# Patient Record
Sex: Female | Born: 1935 | Race: White | Hispanic: No | Smoking: Current every day smoker
Health system: Southern US, Community
[De-identification: ages and names within clinical notes are randomized; demographics above are authoritative.]

## PROBLEM LIST (undated history)

## (undated) DIAGNOSIS — I10 Essential (primary) hypertension: Secondary | ICD-10-CM

## (undated) DIAGNOSIS — J439 Emphysema, unspecified: Secondary | ICD-10-CM

## (undated) DIAGNOSIS — M1990 Unspecified osteoarthritis, unspecified site: Secondary | ICD-10-CM

## (undated) DIAGNOSIS — I472 Ventricular tachycardia: Secondary | ICD-10-CM

## (undated) DIAGNOSIS — I639 Cerebral infarction, unspecified: Secondary | ICD-10-CM

## (undated) DIAGNOSIS — N189 Chronic kidney disease, unspecified: Secondary | ICD-10-CM

## (undated) DIAGNOSIS — I671 Cerebral aneurysm, nonruptured: Secondary | ICD-10-CM

## (undated) DIAGNOSIS — I8390 Asymptomatic varicose veins of unspecified lower extremity: Secondary | ICD-10-CM

## (undated) DIAGNOSIS — E785 Hyperlipidemia, unspecified: Secondary | ICD-10-CM

## (undated) DIAGNOSIS — E041 Nontoxic single thyroid nodule: Secondary | ICD-10-CM

## (undated) DIAGNOSIS — I480 Paroxysmal atrial fibrillation: Secondary | ICD-10-CM

## (undated) DIAGNOSIS — I2510 Atherosclerotic heart disease of native coronary artery without angina pectoris: Secondary | ICD-10-CM

## (undated) DIAGNOSIS — D649 Anemia, unspecified: Secondary | ICD-10-CM

## (undated) DIAGNOSIS — J449 Chronic obstructive pulmonary disease, unspecified: Secondary | ICD-10-CM

## (undated) DIAGNOSIS — H269 Unspecified cataract: Secondary | ICD-10-CM

## (undated) DIAGNOSIS — K862 Cyst of pancreas: Secondary | ICD-10-CM

## (undated) DIAGNOSIS — I739 Peripheral vascular disease, unspecified: Secondary | ICD-10-CM

## (undated) DIAGNOSIS — E119 Type 2 diabetes mellitus without complications: Secondary | ICD-10-CM

## (undated) DIAGNOSIS — I4729 Other ventricular tachycardia: Secondary | ICD-10-CM

## (undated) HISTORY — DX: Chronic kidney disease, unspecified: N18.9

## (undated) HISTORY — DX: Anemia, unspecified: D64.9

## (undated) HISTORY — DX: Unspecified osteoarthritis, unspecified site: M19.90

## (undated) HISTORY — DX: Unspecified cataract: H26.9

## (undated) HISTORY — DX: Hyperlipidemia, unspecified: E78.5

## (undated) HISTORY — DX: Asymptomatic varicose veins of unspecified lower extremity: I83.90

## (undated) HISTORY — DX: Type 2 diabetes mellitus without complications: E11.9

## (undated) HISTORY — DX: Cerebral infarction, unspecified: I63.9

## (undated) HISTORY — DX: Peripheral vascular disease, unspecified: I73.9

## (undated) HISTORY — DX: Emphysema, unspecified: J43.9

## (undated) HISTORY — DX: Essential (primary) hypertension: I10

## (undated) HISTORY — DX: Cyst of pancreas: K86.2

## (undated) HISTORY — DX: Chronic obstructive pulmonary disease, unspecified: J44.9

## (undated) HISTORY — DX: Nontoxic single thyroid nodule: E04.1

## (undated) HISTORY — DX: Cerebral aneurysm, nonruptured: I67.1

---

## 1991-02-21 DIAGNOSIS — I671 Cerebral aneurysm, nonruptured: Secondary | ICD-10-CM

## 1991-02-21 HISTORY — DX: Cerebral aneurysm, nonruptured: I67.1

## 1997-02-20 HISTORY — PX: PR VEIN BYPASS GRAFT,AORTO-FEM-POP: 35551

## 1997-05-22 ENCOUNTER — Emergency Department (HOSPITAL_COMMUNITY): Admission: EM | Admit: 1997-05-22 | Discharge: 1997-05-22 | Payer: Self-pay | Admitting: Emergency Medicine

## 1998-01-09 ENCOUNTER — Emergency Department (HOSPITAL_COMMUNITY): Admission: EM | Admit: 1998-01-09 | Discharge: 1998-01-09 | Payer: Self-pay | Admitting: Emergency Medicine

## 2000-03-20 ENCOUNTER — Other Ambulatory Visit: Admission: RE | Admit: 2000-03-20 | Discharge: 2000-03-20 | Payer: Self-pay | Admitting: Internal Medicine

## 2000-10-24 ENCOUNTER — Encounter: Payer: Self-pay | Admitting: Internal Medicine

## 2000-10-24 ENCOUNTER — Encounter: Admission: RE | Admit: 2000-10-24 | Discharge: 2000-10-24 | Payer: Self-pay | Admitting: Internal Medicine

## 2001-03-27 ENCOUNTER — Encounter: Admission: RE | Admit: 2001-03-27 | Discharge: 2001-03-27 | Payer: Self-pay | Admitting: Internal Medicine

## 2001-03-27 ENCOUNTER — Encounter: Payer: Self-pay | Admitting: Internal Medicine

## 2001-10-28 ENCOUNTER — Encounter: Payer: Self-pay | Admitting: Internal Medicine

## 2001-10-28 ENCOUNTER — Encounter: Admission: RE | Admit: 2001-10-28 | Discharge: 2001-10-28 | Payer: Self-pay | Admitting: Internal Medicine

## 2002-10-23 ENCOUNTER — Emergency Department (HOSPITAL_COMMUNITY): Admission: EM | Admit: 2002-10-23 | Discharge: 2002-10-23 | Payer: Self-pay | Admitting: Emergency Medicine

## 2002-10-23 ENCOUNTER — Encounter: Payer: Self-pay | Admitting: Emergency Medicine

## 2003-06-03 ENCOUNTER — Emergency Department (HOSPITAL_COMMUNITY): Admission: EM | Admit: 2003-06-03 | Discharge: 2003-06-03 | Payer: Self-pay | Admitting: Emergency Medicine

## 2003-06-23 ENCOUNTER — Emergency Department (HOSPITAL_COMMUNITY): Admission: EM | Admit: 2003-06-23 | Discharge: 2003-06-24 | Payer: Self-pay | Admitting: Emergency Medicine

## 2007-03-14 ENCOUNTER — Encounter: Admission: RE | Admit: 2007-03-14 | Discharge: 2007-03-14 | Payer: Self-pay | Admitting: Internal Medicine

## 2007-03-18 ENCOUNTER — Encounter: Admission: RE | Admit: 2007-03-18 | Discharge: 2007-03-18 | Payer: Self-pay | Admitting: Internal Medicine

## 2007-03-22 ENCOUNTER — Ambulatory Visit: Payer: Self-pay | Admitting: Vascular Surgery

## 2007-06-25 ENCOUNTER — Encounter: Admission: RE | Admit: 2007-06-25 | Discharge: 2007-06-25 | Payer: Self-pay | Admitting: Internal Medicine

## 2007-07-02 ENCOUNTER — Ambulatory Visit (HOSPITAL_COMMUNITY): Admission: RE | Admit: 2007-07-02 | Discharge: 2007-07-02 | Payer: Self-pay | Admitting: Internal Medicine

## 2007-07-16 ENCOUNTER — Telehealth: Payer: Self-pay | Admitting: Gastroenterology

## 2007-07-24 ENCOUNTER — Ambulatory Visit: Payer: Self-pay | Admitting: Gastroenterology

## 2007-07-24 DIAGNOSIS — R933 Abnormal findings on diagnostic imaging of other parts of digestive tract: Secondary | ICD-10-CM

## 2007-07-24 LAB — CONVERTED CEMR LAB
Alkaline Phosphatase: 62 units/L (ref 39–117)
Amylase: 63 units/L (ref 27–131)
Basophils Absolute: 0 10*3/uL (ref 0.0–0.1)
Basophils Relative: 0.5 % (ref 0.0–1.0)
CO2: 28 meq/L (ref 19–32)
Creatinine, Ser: 0.6 mg/dL (ref 0.4–1.2)
GFR calc Af Amer: 125 mL/min
GFR calc non Af Amer: 104 mL/min
Glucose, Bld: 319 mg/dL — ABNORMAL HIGH (ref 70–99)
HCT: 33.4 % — ABNORMAL LOW (ref 36.0–46.0)
Hemoglobin: 11.5 g/dL — ABNORMAL LOW (ref 12.0–15.0)
Lipase: 22 units/L (ref 11.0–59.0)
MCHC: 34.3 g/dL (ref 30.0–36.0)
Monocytes Absolute: 0.6 10*3/uL (ref 0.1–1.0)
Monocytes Relative: 7.5 % (ref 3.0–12.0)
Neutro Abs: 4.3 10*3/uL (ref 1.4–7.7)
RBC: 3.65 M/uL — ABNORMAL LOW (ref 3.87–5.11)
RDW: 12.9 % (ref 11.5–14.6)
Sodium: 141 meq/L (ref 135–145)
Total Bilirubin: 0.6 mg/dL (ref 0.3–1.2)
Total Protein: 7.6 g/dL (ref 6.0–8.3)
aPTT: 28 s (ref 21.7–29.8)

## 2007-07-25 ENCOUNTER — Telehealth: Payer: Self-pay | Admitting: Gastroenterology

## 2007-08-01 ENCOUNTER — Ambulatory Visit (HOSPITAL_COMMUNITY): Admission: RE | Admit: 2007-08-01 | Discharge: 2007-08-01 | Payer: Self-pay | Admitting: Gastroenterology

## 2007-08-01 ENCOUNTER — Encounter: Payer: Self-pay | Admitting: Gastroenterology

## 2007-08-05 ENCOUNTER — Encounter: Payer: Self-pay | Admitting: Gastroenterology

## 2007-08-06 ENCOUNTER — Telehealth: Payer: Self-pay | Admitting: Gastroenterology

## 2007-08-07 ENCOUNTER — Telehealth: Payer: Self-pay | Admitting: Gastroenterology

## 2007-08-08 ENCOUNTER — Ambulatory Visit: Payer: Self-pay | Admitting: Cardiology

## 2007-08-08 ENCOUNTER — Encounter (INDEPENDENT_AMBULATORY_CARE_PROVIDER_SITE_OTHER): Payer: Self-pay | Admitting: Internal Medicine

## 2007-08-08 ENCOUNTER — Inpatient Hospital Stay (HOSPITAL_COMMUNITY): Admission: AD | Admit: 2007-08-08 | Discharge: 2007-08-26 | Payer: Self-pay | Admitting: Pulmonary Disease

## 2007-08-08 ENCOUNTER — Ambulatory Visit: Payer: Self-pay | Admitting: Internal Medicine

## 2007-08-08 ENCOUNTER — Ambulatory Visit: Payer: Self-pay | Admitting: Gastroenterology

## 2007-08-09 ENCOUNTER — Encounter (INDEPENDENT_AMBULATORY_CARE_PROVIDER_SITE_OTHER): Payer: Self-pay | Admitting: Internal Medicine

## 2007-11-05 ENCOUNTER — Ambulatory Visit: Payer: Self-pay | Admitting: Gastroenterology

## 2007-11-22 ENCOUNTER — Telehealth: Payer: Self-pay | Admitting: Gastroenterology

## 2007-11-26 ENCOUNTER — Telehealth: Payer: Self-pay | Admitting: Gastroenterology

## 2007-11-26 ENCOUNTER — Encounter: Admission: RE | Admit: 2007-11-26 | Discharge: 2007-11-26 | Payer: Self-pay | Admitting: Internal Medicine

## 2007-12-12 ENCOUNTER — Telehealth: Payer: Self-pay | Admitting: Gastroenterology

## 2007-12-12 ENCOUNTER — Encounter: Payer: Self-pay | Admitting: Gastroenterology

## 2007-12-16 ENCOUNTER — Encounter: Payer: Self-pay | Admitting: Gastroenterology

## 2008-03-05 ENCOUNTER — Encounter: Admission: RE | Admit: 2008-03-05 | Discharge: 2008-03-05 | Payer: Self-pay | Admitting: Internal Medicine

## 2008-04-30 ENCOUNTER — Ambulatory Visit: Payer: Self-pay | Admitting: Vascular Surgery

## 2009-02-05 ENCOUNTER — Encounter: Admission: RE | Admit: 2009-02-05 | Discharge: 2009-02-05 | Payer: Self-pay | Admitting: Internal Medicine

## 2009-04-28 IMAGING — RF DG ABDOMEN 1V
3 series · 3 of 3 positions shown · non-contrast
Comparison: 08/11/2007

CLINICAL DATA: Evaluate panda tube placement.

ABDOMEN - 1 VIEW

[Series 1: run · 1 of 1 slices shown (1 of 3)]
[im 1/1]
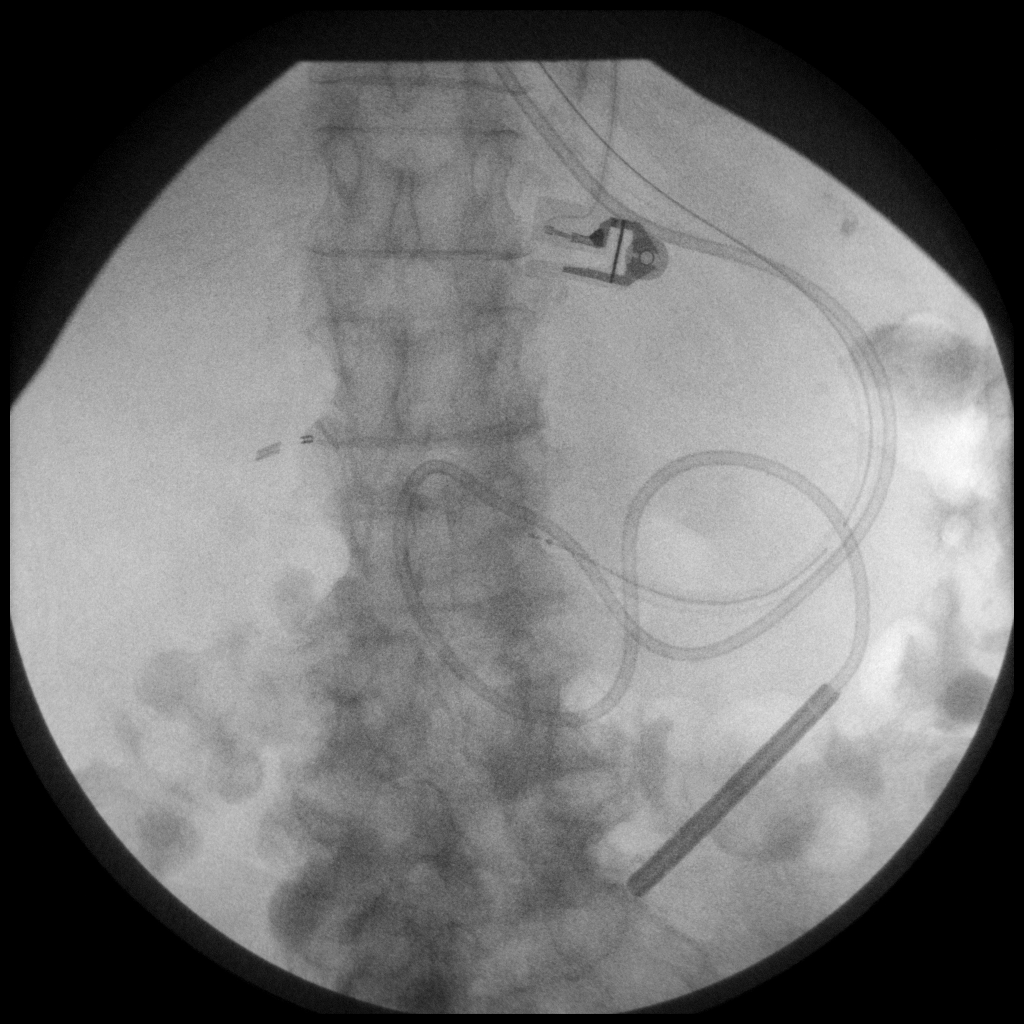

[Series 2: run · 1 of 1 slices shown (2 of 3)]
[im 1/1]
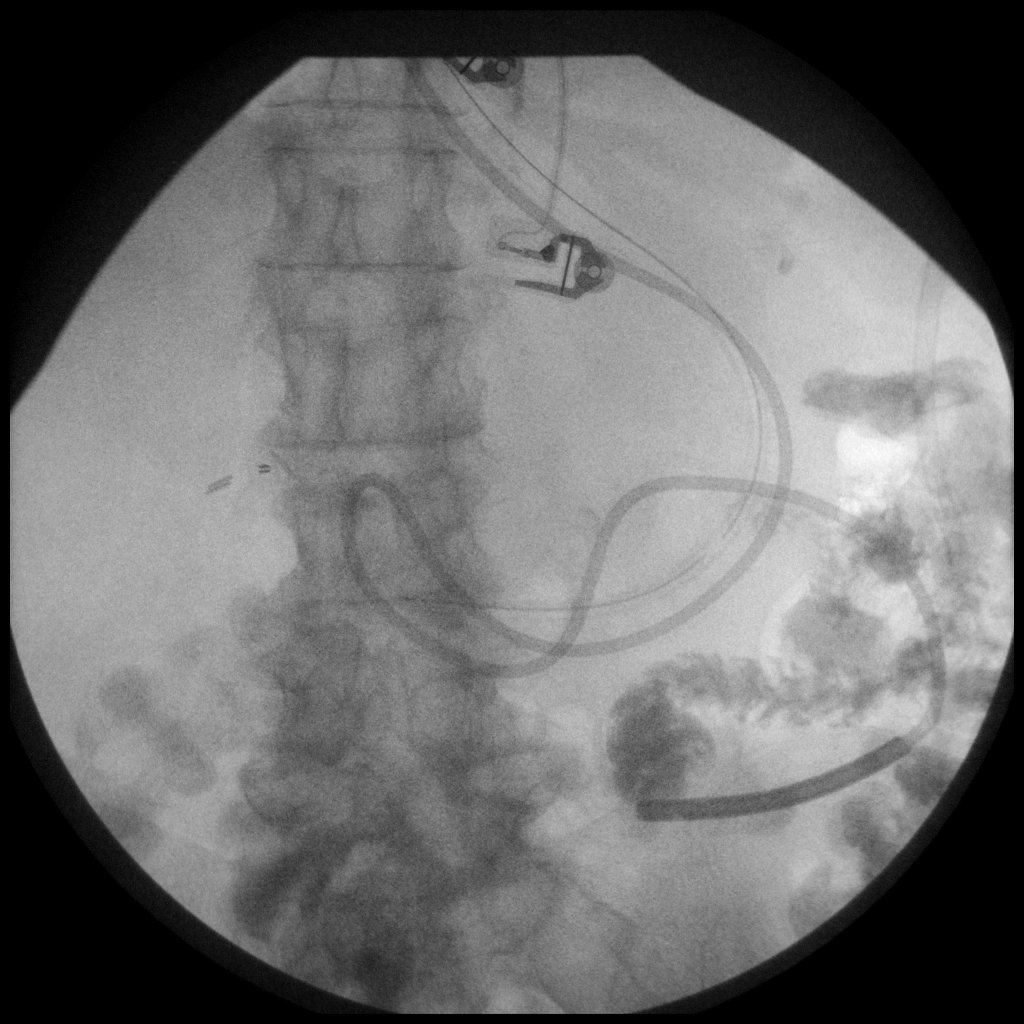

[Series 3: run · 1 of 1 slices shown (3 of 3)]
[im 1/1]
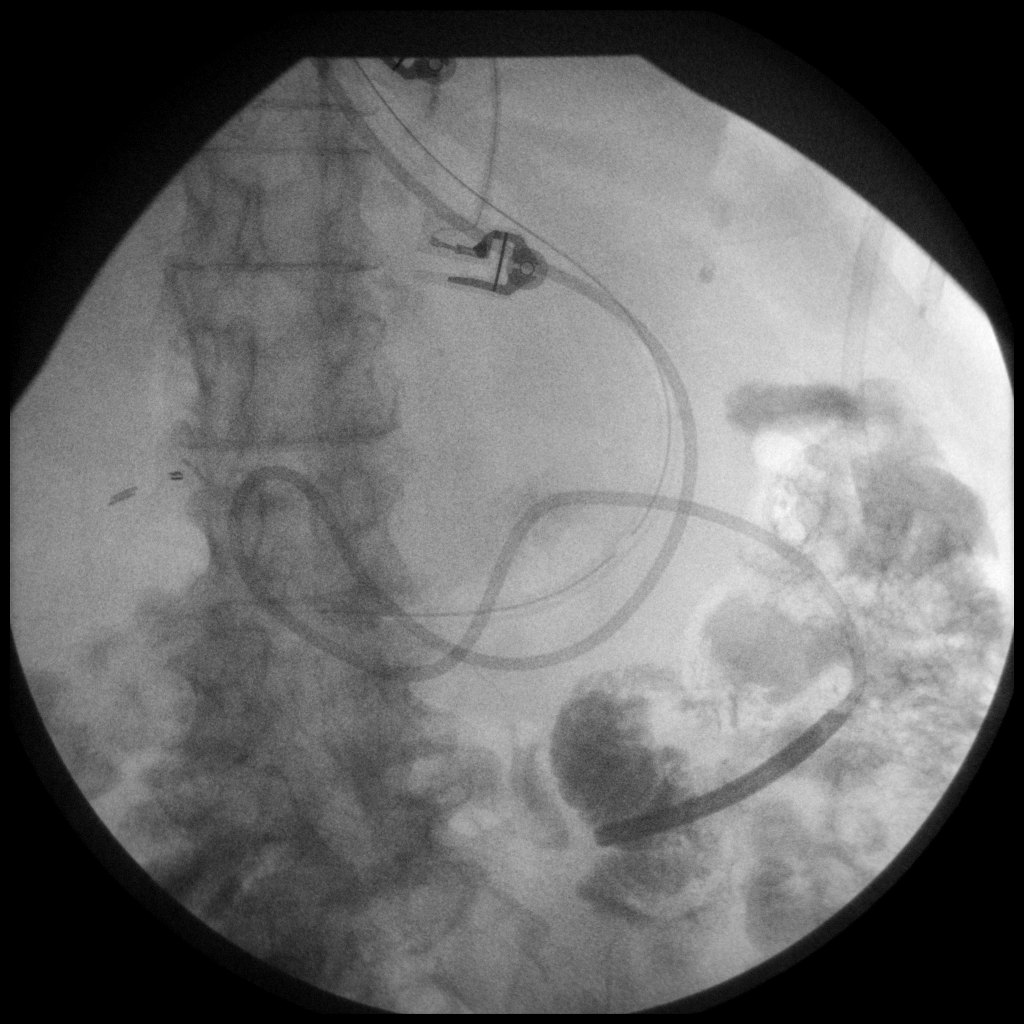

[3 of 3 positions shown; findings below may reference images not displayed]

FINDINGS: Feeding tube has been placed.  The feeding tube tip is
beyond the level of the ligament of Treitz.  Injection of water-
soluble contrast material confirms jejunal placement of the feeding
tube.
IMPRESSION: 1.  Tip of feeding tube beyond the level of the ligament of Treitz
within the proximal jejunum.

## 2009-04-28 IMAGING — CR DG CHEST 1V PORT
1 series · 1 of 1 positions shown · non-contrast
Comparison: 08/11/2007

CLINICAL DATA: Metabolic acidosis and renal failure

PORTABLE CHEST - 1 VIEW

[AP]
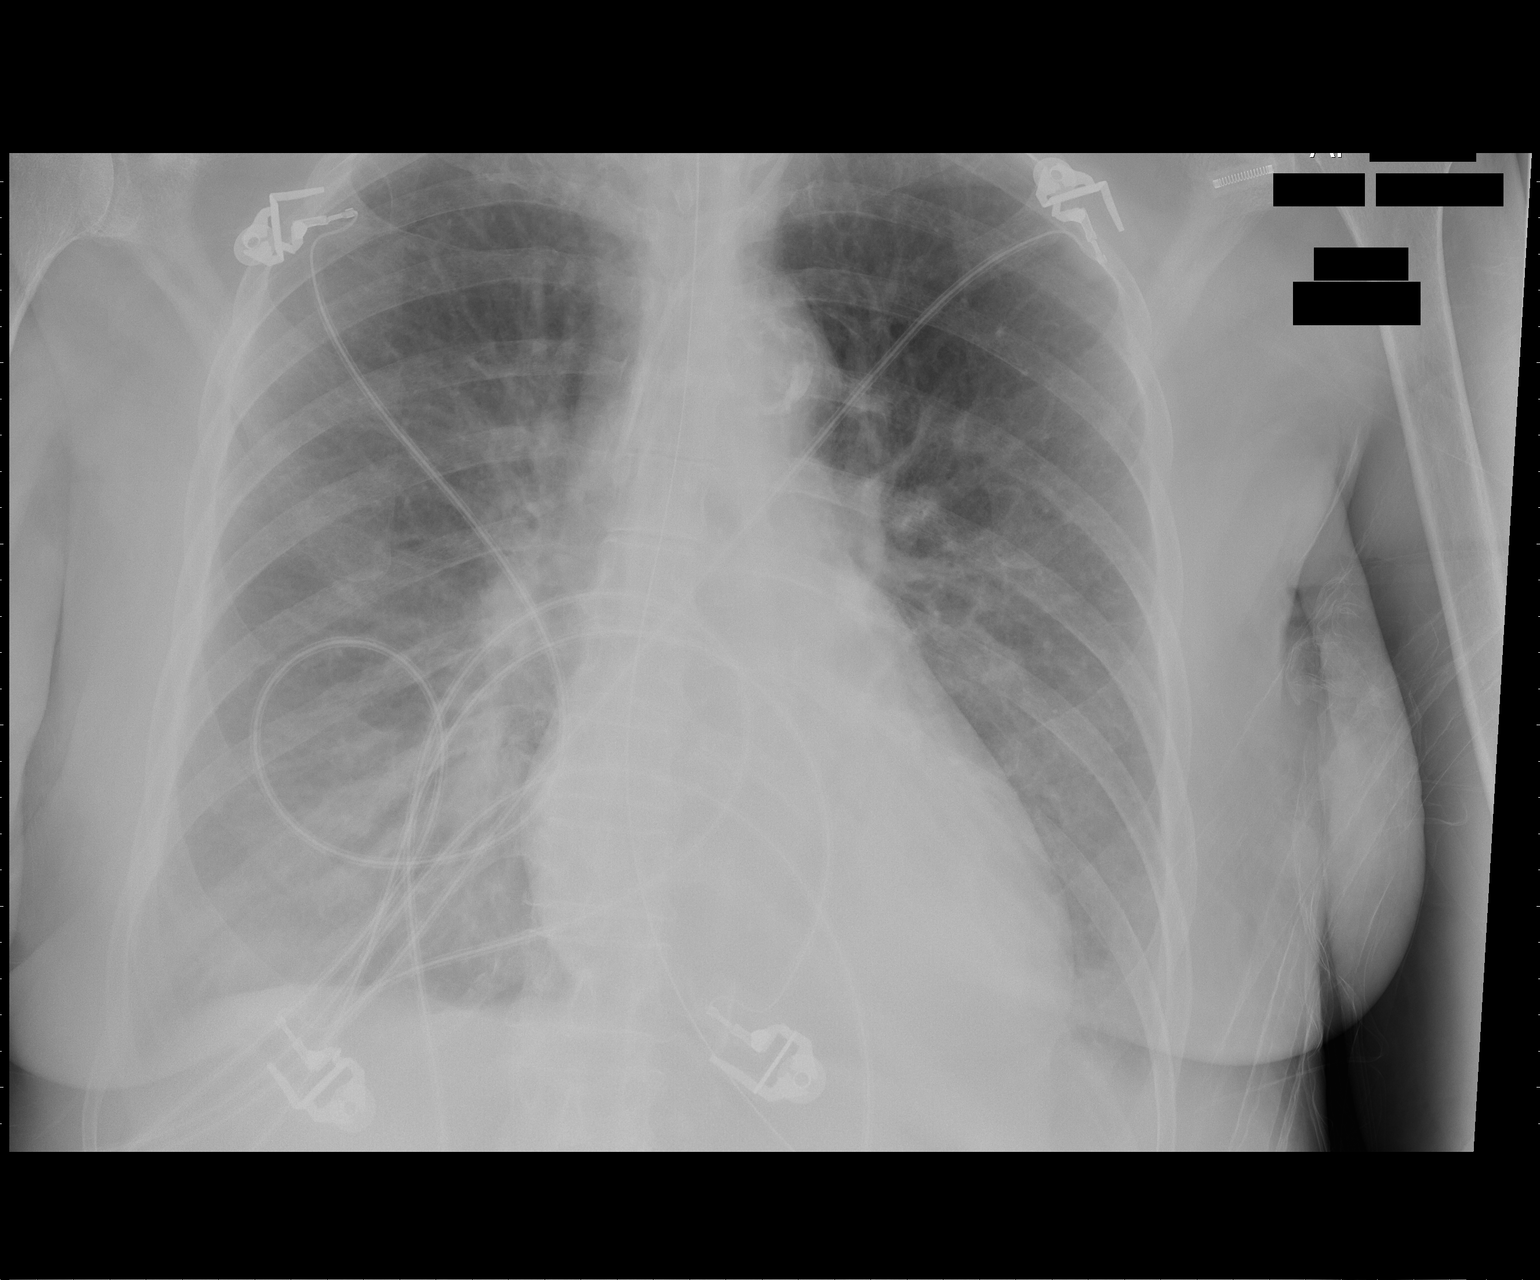

[1 of 1 positions shown; findings below may reference images not displayed]

FINDINGS: There is a left IJ catheter with tip in the SVC.

There is a nasogastric tube with tip in the stomach.

Heart size is normal.

The small effusions and interstitial edema consistent with mild
CHF.
IMPRESSION: 1.  Mild congestive heart failure.

## 2009-04-29 IMAGING — CR DG CHEST 1V PORT
1 series · 1 of 1 positions shown · non-contrast
Comparison: 1 day prior

CLINICAL DATA: Metabolic acidosis.  Renal failure.

PORTABLE CHEST - 1 VIEW

[AP]
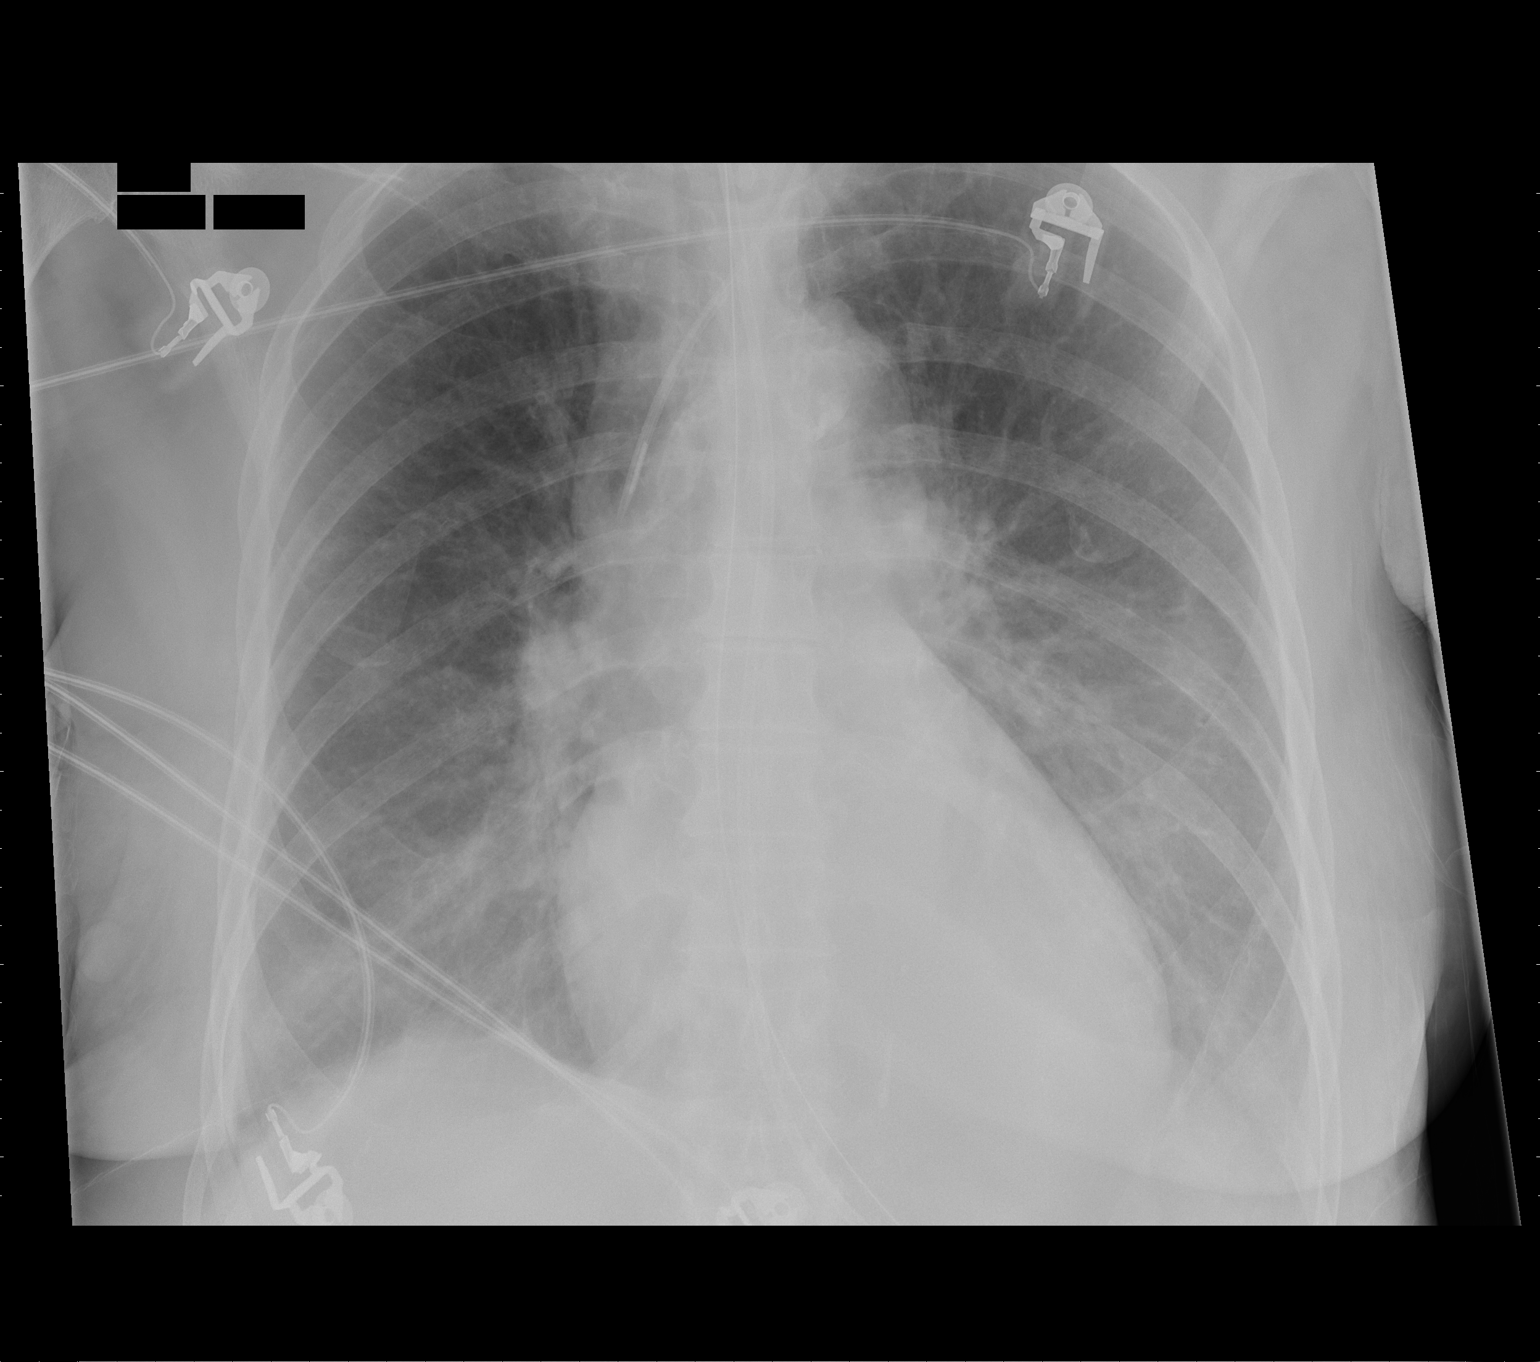

[1 of 1 positions shown; findings below may reference images not displayed]

FINDINGS: Nasogastric and feeding tubes extend beyond the  inferior
aspect of the film.    Left IJ line is unchanged. Normal heart size
and mediastinal contours. Small layering bilateral pleural
effusions are similar. No pneumothorax.  Minimal improvement in
interstitial edema.  Left lower lobe airspace disease is unchanged.
IMPRESSION: 1.  Slight improvement in congestive failure with persistent
bilateral pleural effusions.
2.  Left lower lobe atelectasis versus less likely infection.
3.  Interval placement of a feeding tube, extending beyond inferior
aspect the film.

## 2009-04-30 IMAGING — CR DG CHEST 1V PORT
1 series · 1 of 1 positions shown · non-contrast
Comparison: 08/13/2007.

CLINICAL DATA: Respiratory distress.

PORTABLE CHEST - 1 VIEW

[AP]
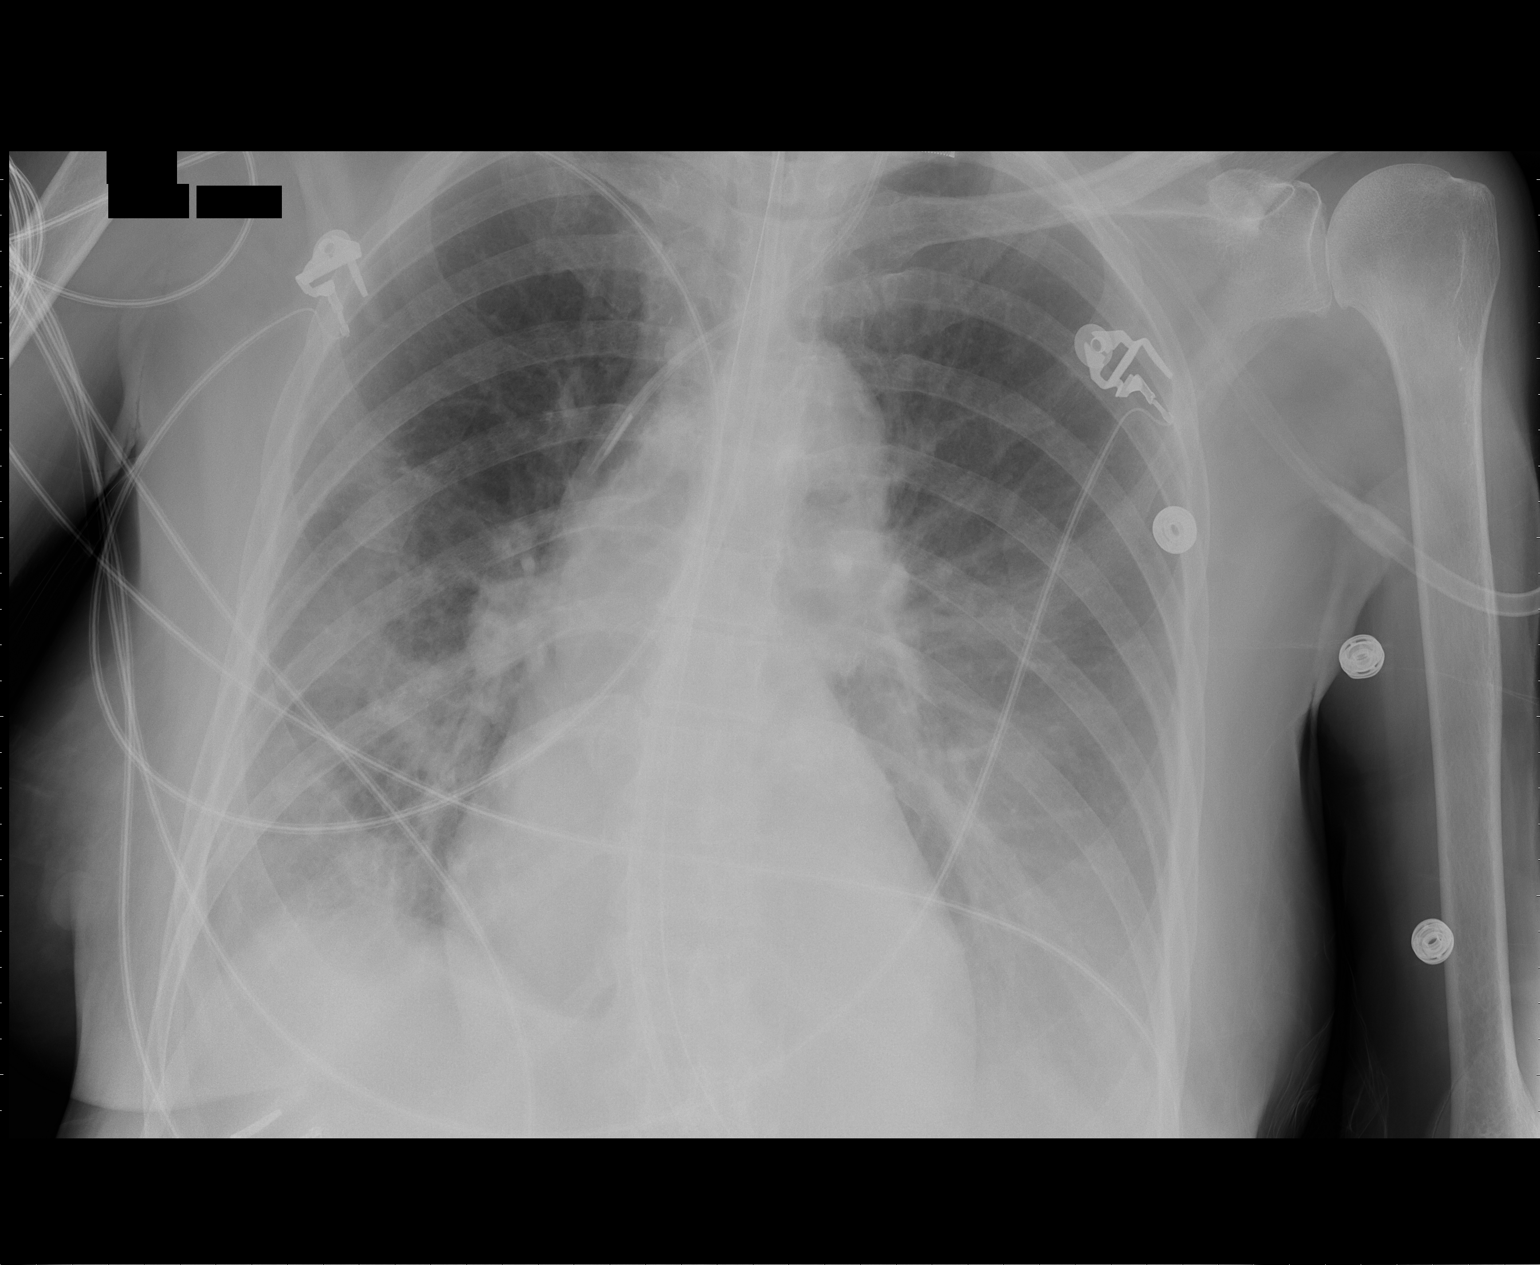

[1 of 1 positions shown; findings below may reference images not displayed]

FINDINGS: Nasogastric tube and feeding tube are followed into the
stomach with the tips projecting beyond the inferior boundary the
film.  Left IJ central line terminates in the SVC.  Pulmonary
edema, bilateral pleural effusions and bibasilar atelectasis
persist.
IMPRESSION: Pulmonary edema, bilateral pleural effusions and bibasilar
atelectasis.

## 2009-05-03 IMAGING — CR DG CHEST 1V PORT
1 series · 1 of 1 positions shown · non-contrast
Comparison: Portable exam 6534 hours compared to 08/15/2007

CLINICAL DATA: Respiratory distress, weakness, renal failure,
metabolic acidosis

PORTABLE CHEST - 1 VIEW

[view not recorded]
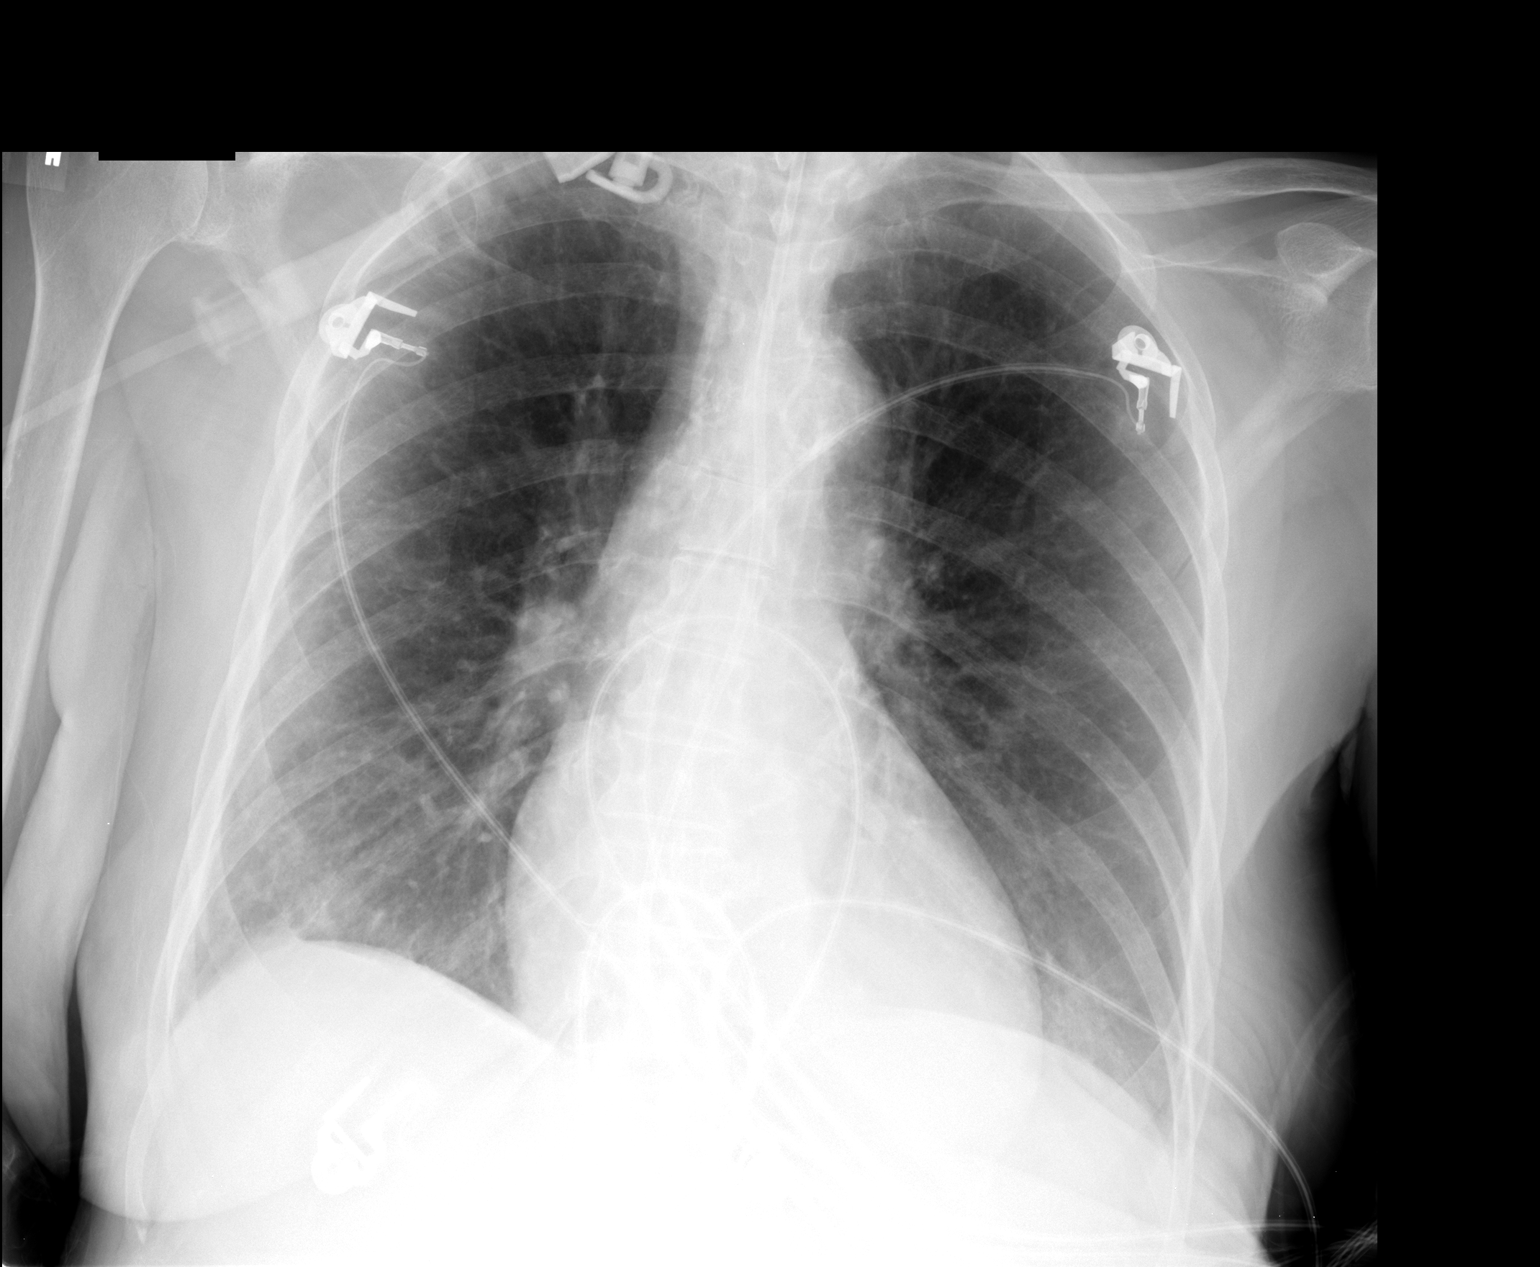

[1 of 1 positions shown; findings below may reference images not displayed]

FINDINGS: Normal heart size and pulmonary vascularity for technique.
Calcified tortuous aorta.
Feeding tube extends into stomach.
Cardiac monitoring lines project over chest.
Improving pulmonary infiltrates and basilar atelectasis.
Remaining lungs clear.
IMPRESSION: Improving pulmonary aeration since previous exam.

## 2010-02-18 ENCOUNTER — Encounter
Admission: RE | Admit: 2010-02-18 | Discharge: 2010-02-18 | Payer: Self-pay | Source: Home / Self Care | Attending: Internal Medicine | Admitting: Internal Medicine

## 2010-03-13 ENCOUNTER — Encounter: Payer: Self-pay | Admitting: Internal Medicine

## 2010-07-05 NOTE — Discharge Summary (Signed)
Chelsey Bishop, Chelsey Bishop                 ACCOUNT NO.:  [id number]   MEDICAL RECORD NO.:  [id number]          PATIENT TYPE:  INP   LOCATION:  5523                         FACILITY:  [facility]   PHYSICIAN:  Chelsey Scott, MD     DATE OF BIRTH:  April 29, 1929   DATE OF ADMISSION:  08/08/2007  DATE OF DISCHARGE:  08/26/2007                               DISCHARGE SUMMARY   PRIMARY MEDICAL DOCTOR:  Dr. Nila Bishop.   GASTROENTEROLOGIST:  Dr. Melvia Bishop of [facility].   DISCHARGE DIAGNOSES:  1. Uncontrolled type 2 diabetes on insulin.  2. Uncontrolled hypertension.  3. Acute renal failure - resolved.  4. Clostridium difficile colitis.  5. Ventricular tachycardia.  Normal Myoview.  6. Resolved septic shock, MODS, ventilatory-dependent respiratory      failure.  7. Sepsis ? secondary to pancreatitis post endoscopic ultrasound and      FNA of pancreatic pseudocyst.  8. Hypokalemia.  9. Hypomagnesemia.  10.Anemia.  11.? Pancreatitis, post endoscopic ultrasound and FNA of pancreatic      pseudocyst..  12.Dementia.  13.Elevated troponins.  14.Old left frontal lobe infarct on CT  15.Lingular nodule on CT chest - outpatient followup & evaluation as      deemed necessary.   DISCHARGE MEDICATIONS:  1. Flagyl 250 mg p.o. q.i.d. through 8th of July 2009, then      discontinue.  2. Protonix 40 mg p.o. daily.  3. Metoprolol 50 mg p.o. b.i.d.  4. Enteric-coated aspirin 81 mg p.o. daily.  5. Zocor 40 mg p.o. every night.  6. Prednisone taper per instructions.  7. Lisinopril 40 mg p.o. daily.  8. NovoLog with FlexPen 4 units at each meal.  9. Lantus 16 units in the a.m. daily.  10.Vitamin B12 1000 mcg p.o daily  11.Thiamine 100 mg p.o daily   DISCONTINUED MEDICATIONS:  Procardia, Januvia, metformin, Amaryl.   PERTINENT PROCEDURES:  1. Myoview.  Impression:  Normal myocardial perfusion study.  2. Multiple chest x-rays since the patient was in ventilator-dependent      respiratory failure.  Most  recent one on August 17, 2007 -      impression:  Improving pulmonary aeration since previous exam.  3. Abdominal x-ray on June 22:  Tip of feeding tube beyond the level      of ligamentous of Treitz within the proximal jejunum.  4. Multiple x-rays of the abdomen.  5. CT of the head without contrast on June 19 - impression:      a.     No acute intracranial abnormality.      b.     Severe cerebral atrophy and small-vessel disease.      c.     Remote left frontal lobe infarct.      d.     Motion-degraded exam.  6. CT of the chest without contrast - impression:      a.     No evidence of pneumonia.      b.     Lingular nodule is stable and has been evaluated on recent       studies.  c.     Small bilateral pleural effusions.      d.     Dilated contrast from the esophagus suggests dysmotility.       The nasogastric tube terminates in the distal esophagus and should       be advanced.  7. CT of the abdomen without contrast - impression:      a.     Limited exam due to lack of IV contrast and mild motion       artifact.      b.     Descending colitis.  Likely infectious.      c.     Limited evaluation of the pancreas with peripancreatic       fluid, edema suspicious for pancreatitis.  Likely secondary       gastritis and enteritis as described.      d.     New small amount of ascites.  8. CT of the pelvis without contrast - impression:      a.     Small ascites.      b.     Otherwise no acute pelvic process.   PERTINENT LABORATORY DATA:  BMET on July 4 unremarkable with BUN 25,  creatinine 1.13.  Most recent CBC with hemoglobin 9.3, hematocrit 27.8,  white blood cell 9.4, platelets 234.  Vitamin B1, 9.  Clostridium  difficile toxin on June 24 positive.  RBC folate 456, vitamin B12, 203.  Hepatitis B surface antigen negative.   CONSULTATIONS:  1. Renal, Dr. Hyman Bishop.  2. Pulmonary critical care, Dr.  Tyson Bishop.  3. Cardiology.  4. Endocrinology, Dr. Lucianne Bishop.  5. Neurology, Dr. Lesia Bishop.   HOSPITAL COURSE AND PATIENT DISPOSITION:  This patient was initially  admitted by the pulmonary critical care team, and her care was  transferred to the [facility] hospitalist service on August 19, 2007.  In  summary, Ms. Bishop is a pleasant 77 year old female patient with  history of diabetes type 2, hypertension, recent episode of blindness  being followed by neurology, COPD who had endoscopic ultrasound  aspiration of pancreatic cyst on August 02, 2007 which was consistent with  pseudocyst.  She was subsequently admitted to [facility] on June  17 after history of indigestion, bloating, heartburn, crampy abdominal  pain, nausea and vomiting.  She rapidly worsened and went into  multisystem organ failure and developed blindness.  She was thereby  transferred to [facility] for further evaluation and  management.  She also was in shock, acute renal failure, hyperkalemia.  She was admitted to the ICU and was intubated.  Septic shock was treated  appropriately and resolved.  The patient received hemodialysis under  renal team supervision.  Neurology followed the patient for her new  onset of blindness, and pulmonary critical care managed her vent issues.  Her renal failure was thought to be secondary to acute tubular necrosis  and multiorgan failure.  She was on pressors and steroids when she was  in septic shock and empiric antibiotics of IV imipenem and vancomycin.  Workup for blindness was done.  CT of the head was negative.  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  The patient also had elevated troponins.  Cardiology was consulted.  She  had an episode of ventricular tachycardia.  Cardiology did extensive  evaluation.  Myoview was negative.  They have advised continuing her  medication and titrating lisinopril to adequate blood pressure control.  Her blindness subsequently  resolved.  She was transferred to the medical floor,  and her care was handed over  to the [facility] hospitalist team.  Since she had been on our service,  the predominant issue has been electrolyte abnormalities of hypokalemia  and hypomagnesemia and diabetic control.  This diabetic control is  fluctuating following which endocrinology consult was obtained.  Her  steroids were contributing to this uncontrolled diabetes.  The steroids  are being rapidly tapered, and her insulin was adjusted with fair  control.  The patient at this time is asymptomatic with stable exams.  Her blood pressure is still not optimal, ranging in the 170s to 190s  systolic over 70s to 80s diastolic.  Will increase lisinopril today.  The patient is advised to follow-up with her primary medical doctor in a  week's time regarding adjusting her diabetic and blood pressure  medications.  She is also going to get advanced home care with necessary  equipment at home.      Chelsey Scott, MD  Electronically Signed     AH/MEDQ  D:  08/26/2007  T:  08/26/2007  Job:  [id number]   cc:   Erskine Speed, M.D.  Barbette Hair. Arlyce Dice, MD,FACG  Garnetta Buddy, M.D.  Nelda Bucks, MD  Reather Littler, M.D.  Marlan Palau, M.D.

## 2010-07-05 NOTE — Procedures (Signed)
BYPASS GRAFT EVALUATION   INDICATION:  Followup right leg bypass graft.   HISTORY:  Diabetes:  Yes.  Cardiac:  No.  Hypertension:  Yes.  Smoking:  No, quit in October of 2008.  Previous Surgery:  Right femoral to below knee popliteal artery bypass  graft with translocated nonreverse saphenous vein on 06/24/1993 by Dr.  Arbie Cookey.   SINGLE LEVEL ARTERIAL EXAM                               RIGHT              LEFT  Brachial:                    170                164  Anterior tibial:             78                 100  Posterior tibial:            Inaudible          88  Peroneal:                    74  Ankle/brachial index:        0.46               0.59   PREVIOUS ABI:  Date: 12/03/2001  RIGHT:  1.0  LEFT:  0.69   LOWER EXTREMITY BYPASS GRAFT DUPLEX EXAM:   DUPLEX:  Doppler arterial waveforms are biphasic proximal to, throughout  and distal to the graft.   IMPRESSION:  1. Patent right femoral to popliteal artery bypass graft.  2. ABIs are decreased bilaterally right greater than left.  3. The patient is complaining of some right leg discomfort, however,      she does have some problems with discomfort in her right leg in the      past.  4. A preliminary copy was sent with the patient to Dr. Thomasene Lot office.   ___________________________________________  Larina Earthly, M.D.   DP/MEDQ  D:  03/22/2007  T:  03/22/2007  Job:  [id number]   cc:   Erskine Speed, M.D.

## 2010-07-05 NOTE — Consult Note (Signed)
Chelsey Bishop, Chelsey Bishop                 ACCOUNT NO.:  [id number]   MEDICAL RECORD NO.:  [id number]          PATIENT TYPE:  INP   LOCATION:  2103                         FACILITY:  [facility]   PHYSICIAN:  Madolyn Frieze. Jens Som, MD, FACCDATE OF BIRTH:  1930-01-27   DATE OF CONSULTATION:  08/15/2007  DATE OF DISCHARGE:                                 CONSULTATION   Chelsey Bishop is a 77 year old female with multiple medical problems who I  am asked to evaluate for tachycardia.  The patient's history dates back  to an episode of severe pancreatitis in 2008.  She underwent recent  EGD/ultrasound-guided aspiration of a pancreatic cyst on August 01, 2007.  She was admitted to [facility] on August 01, 2007, with shock,  acute renal failure, and multisystem organ dysfunction.  She was  transferred to [facility] on August 08, 2007.  Note, on  admission, she was septic, on multiple pressors.  She also had acute  renal failure with BUN and creatinine of 59 and 7.69.  She has been  treated with antibiotics and slowly improved and has been off pressors  for approximately 4-5 days.  She was also dialyzed transiently, but is  now making more urine and the dialysis catheter has been removed.  Also  note, on admission that her CK-MB and troponin I were elevated.  This  was at 135 and 15.1 and the troponin I was 0.38.  There were no followup  enzymes drawn.  During the admission, she has been noted to be  intermittently tachycardic and we were asked to further evaluate.  Note,  the patient is confused and apparently has baseline dementia with  superimposed delirium, which has improved as well.  However, she is  unaware of where she is this morning and thinks her age is 35.  However,  she denies any chest pain, dyspnea, palpitations, or presyncope.   CURRENT MEDICATIONS:  Vancomycin, Solu-Cortef, subcutaneous heparin,  Protonix 40 mg daily, prednisone 20 mg p.o. daily, Norvasc 10 mg p.o.  daily, insulin,  vitamin B12, and Primaxin.   SOCIAL HISTORY:  Obtained from the chart.  She apparently lives in  [place] and has 2 children.  One son was killed and she has a surviving  daughter.  She does have a history of tobacco abuse, but apparently does  not smoke at present.  She also occasionally consumes alcohol.   FAMILY HISTORY:  Significant for her mother who had a stroke.  Her  father had a myocardial infarction.  One sibling had a myocardial  function as well per the chart.   PAST MEDICAL HISTORY:  Significant for diabetes mellitus as well as  hypertension.  She apparently had a cerebral aneurysm resection in 1993  as well as left cataract surgery.  She has a recent episode of blindness  that Neurology is seeing her for.  She has acute renal failure.  She  also has a history of partial hysterectomy as well as cholecystectomy.  She has a history of COPD.  There is also a history of peripheral  vascular disease by report.  REVIEW OF SYSTEMS:  The patient is confused.  She thinks she is 77 years  old and lives in [place].  She is unaware of the year.  However, she denies  any fevers, chills, or productive cough.  There is no hemoptysis.  There  is no dysphagia, odynophagia, or melena.  She apparently is having  significant diarrhea secondary to her Clostridium difficile.  She denies  any chest pain or shortness of breath and there is no pedal edema.  The  remaining systems are negative.   PHYSICAL EXAMINATION:  VITAL SIGNS:  Today, blood pressure 130/80 and  her pulse overnight was evaluated and it was 130.  GENERAL:  She is well developed, extremely frail.  She is in no acute  distress at present.  Her skin is warm and dry.  She does not appear to  be depressed and there is no peripheral clubbing.  BACK:  Normal.  HEENT:  Normal with normal eyelids.  NECK:  Supple with a normal upstroke bilaterally.  I cannot appreciate  bruits or jugular venous distention and there is no thyromegaly.   CHEST:  Clear to auscultation with expansion anteriorly.  CARDIOVASCULAR:  Tachycardic rate, but a regular rhythm.  I cannot  appreciate murmurs, rubs, or gallops.  ABDOMEN:  Nontender and nondistended.  Positive bowel sounds.  No  hepatosplenomegaly.  No mass appreciated.  There is no abdominal bruit.  She has 2+ femoral pulses bilaterally.  No bruits.  EXTREMITIES:  No edema and I can palpate no cords.  Her distal pulses  are diminished.  NEUROLOGIC:  Nonfocal except that she is confused.   LABORATORY DATA:  Magnesium of 2.3.  Her BUN and creatinine today are 49  and 3.28 respectively with a potassium of 3.2.  Her C. diff stool sample  was positive.  Her hemoglobin/hematocrit 11.2 and 34.1 respectively.  White blood cell count is 13.  Her cardiac markers are described in the  HPI.  Note, she did have an echocardiogram performed on March 11, 2007, it showed normal LV function with a calcified aortic valve.  Her  chest x-ray today shows improving edema.  On August 09, 2007, the patient  had a CT of her head and this showed no acute intracranial abnormality.  There was severe cerebral atrophy and small vessel disease and a remote  left frontal lobe infarct.  Chest CT showed no pneumonia and there was  lingular nodule.  There were small bilateral effusions.  CT of her  abdomen showed descending colitis, probably infectious.  There is also  peripancreatic fluid suspicious for pancreatitis.  There is mild  ascites.  Her electrocardiograms are reviewed from this morning.  This  shows ventricular tachycardia most likely at a rate of 128 with a right  bundle morphology.  An electrocardiogram from August 14, 2007, had shown a  sinus rhythm with ventricular bigeminy and a prior septal infarct cannot  be excluded.  There were nonspecific ST changes.  There was also an  electrocardiogram that showed atrial fibrillation.   DIAGNOSES:  1. Probable ventricular tachycardia - I have reviewed       electrocardiograms with Dr. Ladona Bishop.  This appears to be a slow      ventricular tachycardia with retrograde P-waves conduction.  It is      sustained, but the patient is not having symptoms at all and her      blood pressure is normal.  Note, she also has normal left  ventricular function by echocardiogram.  Her potassium this morning      was 3.2 and this has been supplemented.  Her magnesium was normal.      We will begin with treating with IV Lopressor.  Hopefully, this      will keep her in sinus rhythm during the course of this admission.      Some of this may be due to the stress of her acute sepsis, which is      improving as well as her Clostridium difficile colitis.  2. Paroxysmal atrial fibrillation - she did have atrial fibrillation,      but this also occurred in the setting of sepsis.  We will monitor      this.  She has no further episodes and this would not require      further evaluation.  3. Increased cardiac markers - there is one CK-MB that is elevated at      15.1 and a troponin I of 0.38.  I will recheck this today.  It is      difficult to know the significance of this in the setting of severe      sepsis with the patient being on pressors as well as acute renal      failure.  However, she does have a history of diabetes mellitus,      peripheral vascular disease, and now has probable sustained      ventricular tachycardia.  She will most likely need further      ischemia evaluation once she recovers from acute illness, and if      her family would like to be aggressive, then we could consider      cardiac catheterization after she recovers.  4. Sepsis - this is improving and is managed by critical care.  5. Acute renal insufficiency - this is being managed by Nephrology.      This is also improving.  6. Probable pancreatitis.  7. Diabetes mellitus.  8. Confusion/dementia/delirium - management per Neurology.   We will be happy to follow while she is in the  hospital.      Madolyn Frieze. Jens Som, MD, [facility]  Electronically Signed     BSC/MEDQ  D:  08/15/2007  T:  08/16/2007  Job:  [id number]

## 2010-07-05 NOTE — Procedures (Signed)
BYPASS GRAFT EVALUATION   INDICATION:  Followup evaluation of right leg bypass graft.  The patient  complains of left leg edema.   HISTORY:  Diabetes:  Yes.  Cardiac:  No.  Hypertension:  Yes.  Smoking:  Quit in October of 2008.  Previous Surgery:  Right fem-pop bypass graft with translocated  nonreversed saphenous vein on 06/24/1997 by Dr. Arbie Cookey.   SINGLE LEVEL ARTERIAL EXAM                               RIGHT              LEFT  Brachial:                    201                207  Anterior tibial:             120                118  Posterior tibial:                               109  Peroneal:                    108  Ankle/brachial index:        0.58               0.57   PREVIOUS ABI:  Date:  03/22/2007  RIGHT:  0.46  LEFT:  0.58   LOWER EXTREMITY BYPASS GRAFT DUPLEX EXAM:   DUPLEX:  Doppler arterial waveforms are biphasic proximal to, within and  monophasic distal to the right fem-pop bypass graft with an elevated  peak systolic velocity of 203 cm/second in the right external iliac  artery.   IMPRESSION:  1. ABIs are stable from previous study bilaterally.  2. Patent right fem-pop bypass graft.  3. 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   ___________________________________________  Larina Earthly, M.D.   MC/MEDQ  D:  04/30/2008  T:  04/30/2008  Job:  [id number]

## 2010-07-05 NOTE — Consult Note (Signed)
NAMEHAELEY, FORDHAM NO.:  [id number]   MEDICAL RECORD NO.:  [id number]          PATIENT TYPE:  INP   LOCATION:  2103                         FACILITY:  [facility]   PHYSICIAN:  Iva Boop, MD,FACGDATE OF BIRTH:  Nov 01, 1929   DATE OF CONSULTATION:  DATE OF DISCHARGE:                                 CONSULTATION   REFERRING PHYSICIAN:  Nelda Bucks, MD   REASON FOR CONSULTATION:  Multisystem organ failure question  relationship to recent endoscopic ultrasound.   ASSESSMENT:  This is a 77 year old white woman who had an endoscopic  ultrasound, aspiration of a pancreatic cyst on August 01, 2007.  Those  studies were consistent with pseudocyst.  She was subsequently admitted  to [facility] on August 07, 2007 after a couple of days of  indigestion, bloating, heartburn, crampy abdominal pain, and then nausea  and vomiting.  She rapidly worsened and went into multisystem organ  failure as well as developed blindness.  She has been transferred to  [facility] for therapy of the multisystem organ failure.   It is possible she developed some sort of sepsis syndrome after the  aspiration of the cystic lesion of the pancreas.  I do not think she has  pancreatitis necessarily at this time given a normal amylase and lipase.   RECOMMENDATIONS AND PLAN:  I agree with the aggressive supportive care  she was receiving from Critical Care Medicine and Nephrology.  A CT scan  of the abdomen makes sense to try to understand what process is driving  this.  Further plans pending that.  She is on broad-spectrum antibiotics  and pressors.  If she has an abscess then, percutaneous drainage would  make sense.  It may still yet makes sense i.e. percutaneous sampling of  the pseudocyst, if we need more information as to the underlying cause  of her problems.   HISTORY:  A 77 year old white woman who had the endoscopic ultrasound as  noted above.  This was on August 01, 2007.  She had a 4-cm retrogastric  fluid collection lies between the stomach an atrophic pancreas thought  to represent a pseudocyst possibly and the fluid was at a low CEA, high  amylase, and negative cytology.  The fluid was whitish, murky, but thin.  She was given Cipro 500 mg b.i.d. for 3 days after that.  She then  presented on August 07, 2007 to [facility] with nausea, vomiting,  and flank pain as noted.  She had a white count of 17,000 and creatinine  of 8.1.  She was aggressively treated, but subsequently worsened with  hypotension.  She developed blindness with an unrevealing CT scan.  Other imaging studies show a possible lung mass.  She has not had CT  imaging of the abdomen.  She is undergoing aggressive supportive care  with pressors, broad-spectrum antibiotics, and dialysis here at [facility].  These are all being initiated over the past few hours.  She is really unable to communicate at this time as she is intubated.   MEDICATIONS:  At  this time are:  1. Calcium gluconate infusion.  2. Subcutaneous heparin protocol.  3. Hydrocortisone 50 mg IV q.6 h.  4. Primaxin 500 mg IV q.6 h.  5. Protonix 40 mg IV daily.  6. Vancomycin per protocol.  7. Dobutamine infusion.  8. Vasopressin.  9. Norepinephrine.   DRUG ALLERGIES:  None known.   PAST MEDICAL HISTORY:  1. Severe pancreatitis due to choledocholithiasis in 2008 with      cholecystitis status post laparoscopic cholecystectomy.  2. New lung nodule noted at that time.  3. Urinary tract infection.  4. Diabetes.  5. Chronic alcohol use, question alcoholism.  6. Smoker.  7. Type 2 diabetes mellitus.  8. Chronic obstructive pulmonary disease.  9. Aneurysm surgery, brain.  10.Right leg bypass surgery.  11.Anemia of chronic disease.  12.She had problems with failure to thrive.  13.Frequent urinary tract infections.  14.Peripheral vascular disease.   SOCIAL HISTORY:  She is an ex-smoker.  Drinks alcohol  more than  socially, divorced, 2 children.  One son died due to a motor vehicle  accident.   FAMILY HISTORY:  Coronary artery disease in father and diabetes in  mother.   REVIEW OF SYSTEMS:  Really otherwise unobtainable.   PHYSICAL EXAMINATION:  GENERAL:  Reveals a critically ill white woman  lying in bed.  VITAL SIGNS:  Blood pressure was 105/42, pulse 98.  She is intubated.  FIO2 35%, mechanical ventilation.  HEENT:  The endotracheal tube is in the mouth.  Lips and nose looked  normal.  NECK:  Without mass.  LUNGS:  Clear anteriorly.  HEART:  S1 and S2.  Again, no rubs, murmurs, or gallops.  ABDOMEN:  Soft.  Bowel sounds, there may be a few it is soft and  nontender.  There is no organomegaly or mass.  GROIN:  There is a left dialysis catheter in place in the left groin.  EXTREMITIES:  Free of edema.  SKIN:  There is some mottling of the skin.  No acute rash.  Skin is  relatively warm and not cool anywhere.  MENTAL STATUS:  She is sedated, unable to assess, really unresponsive.   LABORATORY DATA:  Amylase and lipase were normal.  Lactic acid is 15.  LDH is 321.  Cardiac panel shows positive troponin of 0.38, CK-MB 15, CK  135, CMET fasting 3.2, BUN 59, creatinine 7.69, glucose 329, AST 40,  otherwise normal LFTs.  Total protein 5.2, albumin 3.3, calcium 6.2.  CBC:  White count 25,000, hemoglobin 9.9, hematocrit 30, platelet count  274, INR 1.4.  PT 17.7, and PTT 32.   I have also reviewed the outside records from [facility] that  were in the chart.      Iva Boop, MD,FACG  Electronically Signed     CEG/MEDQ  D:  08/08/2007  T:  08/09/2007  Job:  [id number]   cc:   Rachael Fee, MD  Nelda Bucks, MD

## 2010-07-05 NOTE — Consult Note (Signed)
NAMETAYLORE, HINDE                 ACCOUNT NO.:  [id number]   MEDICAL RECORD NO.:  [id number]          PATIENT TYPE:  INP   LOCATION:  5523                         FACILITY:  [facility]   PHYSICIAN:  Reather Littler, M.D.       DATE OF BIRTH:  1929/03/10   DATE OF CONSULTATION:  08/23/2007  DATE OF DISCHARGE:                                 CONSULTATION   REASON FOR CONSULTATION:  Diabetes management.   HISTORY:  This 77 year old patient who has known diabetes since 1993 and  the patient of Dr. Nila Nephew.  She apparently has been treated with  oral hypoglycemics including metformin, Amaryl, and Januvia.  The  patient says that about 2 months ago because of her blood sugars getting  higher she was started on Lantus and she was taking about 15 units a day  in the afternoon.  This was given to her by her daughter.   The patient's blood sugar at home prior to admission usually would be  around 150-180 and occasionally higher.  The daughter says that she  would tend to snack during the night, and the patient does not seem to  have much of an idea of a diabetic diet.  The patient did seem to have  no hyperglycemia at home with taking the Lantus insulin.   The patient was admitted on August 08, 2007, with sepsis, encephalopathy,  renal failure, and probable CVA affecting her vision, and her treatment  has been mostly with Lantus insulin and periodically has been getting  the glucose stabilizer.  Recently, the patient has had much more problem  with diabetes control because of her being on steroids.  The patient's  steroid dose is currently 20 mg of prednisone for the last 3 days or so,  which she was getting at 7 a.m. daily.  This was supposed to be reduced  to 10 mg on August 26, 2007.   Review of the patient's blood sugars show that the patient tends to have  marked hyperglycemia periodically including on the night of August 21, 2007, when it was up to 691.  This was treated with a glucose  stabilizer, which brought her blood sugar down to 81.  The patient was  then put on Lantus insulin 5 units yesterday morning and an additional  10 units at 5 p.m. when her blood sugar had gone up to critically higher  levels.  The patient did get sliding-scale insulin presumably about 11  or 12 units, and at 11:30 p.m. she had gone down to 40.  The patient  since then has had fairly good blood sugars overnight except for 309  reading at 10:30 a.m. today.   CURRENT MEDICATIONS:  1. Subcutaneous heparin.  2. Flagyl.  3. Protonix.  4. Prednisone.  5. Lopressor 50 mg b.i.d.  6. Magnesium oxide t.i.d.  7. Lisinopril 10 mg.  8. Aspirin.  9. Zocor.  10.Insulin as above.  11.Ativan p.r.n.   ALLERGIES:  No known allergies.   PAST MEDICAL HISTORY:  She has had cerebral aneurysm in 1993, cataract  surgery,  peripheral vascular disease with left leg bypass surgery, and  she also had episodes of V-tach and atrial fibrillation during her  hospitalization last month.  Apparently, the patient also had history of  severe acute pancreatitis due to cholelithiasis in 2008, needing  cholecystectomy, and also has a history of urinary tract infections.   PERSONAL HISTORY:  She is divorced and is living with her daughter.  She  is not smoking currently, but did smoke in the past.  She drinks small  amount of alcohol apparently only about 1 or 2 beers a day.   FAMILY HISTORY:  Positive for coronary artery disease and her mother had  diabetes.   REVIEW OF SYSTEMS:  The patient has had hypertension.  She has had  diabetes as above.  She does not complain of any numbness in her feet.  She is having good vision and is due for eye exam, which was done about  3 years ago.  She did not complain of any chest pain.  She had an  episode of blindness, which is probably related to occipital CVA, and  she had recent renal failure which seems to have resolved.  She has a  history of COPD and is on prednisone for  this.  Does not have any joint  pains or GI problems recently, although there is a history of C.  difficile enterocolitis in the last admission.  Notably, the patient was  on Prilosec and Procardia at home, is also was taking metformin,  lisinopril, and glimepiride.  She did have acute respiratory failure  when she was admitted with sepsis, and she is now doing fairly well on  room air.   PHYSICAL EXAMINATION:  GENERAL:  The patient is averagely built with an  approximate weight of about 160 pounds.  She is alert and cooperative  although may be slightly confused.  VITAL SIGNS:  Her blood pressure is 186/75, pulse is 64, and temperature  97.  She looks mildly pale.  She is not cyanotic.  EYES:  Externally normal.  Her pupils are small and unable to see her  fundus clearly.  NECK:  Shows no lymphadenopathy or thyroid enlargement.  No carotid  bruits.  HEART:  Sounds are normal.  LUNGS:  Show few basal crepitations.  ABDOMEN:  Slightly distended, but no mass or tenderness.  EXTREMITIES:  She has absent ankle reflexes.  Her pedal pulses are  decreased bilaterally.   ASSESSMENT:  The patient has poorly-controlled diabetes secondary to  prednisone.  Currently, the patient is needing large doses of insulin  during the day because of her prednisone and tends to get hyperglycemic  when given higher doses of NovoLog especially in the evening time.   PLAN:  Plan would be to start Lantus b.i.d. and adjust the dose as  needed.  We will also use NovoLog at mealtimes particularly at breakfast  and lunch to cover the effect of prednisone during the day.  Thank you  for the consultation.  We will follow.      Reather Littler, M.D.  Electronically Signed     AK/MEDQ  D:  08/23/2007  T:  08/23/2007  Job:  [id number]   cc:   Marcellus Scott, MD

## 2010-07-05 NOTE — Consult Note (Signed)
NAMEMarland Kitchen  Chelsey Bishop NO.:  [id number]   MEDICAL RECORD NO.:  [id number]          PATIENT TYPE:  INP   LOCATION:  2103                         FACILITY:  [facility]   PHYSICIAN:  Marlan Palau, M.D.  DATE OF BIRTH:  02-08-1930   DATE OF CONSULTATION:  08/08/2007  DATE OF DISCHARGE:                                 CONSULTATION   HISTORY OF PRESENT ILLNESS:  Chelsey Bishop is a 77 year old right-handed  white female born 1930-01-11, with a history of diabetes,  hypertension, and prior cerebral aneurysm clipping procedure in 1993.  The patient has had some residual memory disturbance, speech problems  since that time.  The patient, however, was transferred to this hospital  with sepsis syndrome that began within a week after an  abdominal/pancreatic cyst was drained.  The patient is severely acidotic  and is hypotensive.  The patient has also developed acute renal failure  over and above her sepsis syndrome.  On the evening prior to transfer to  [facility], the patient noted the fact that she could not see  with either one of her eyes.  The patient was not intubated at that  point, was able to verbalize this.  The patient is now intubated.  The  patient remains severely acidotic.  Neurology is asked to see this  patient for further evaluation.  The patient is moving all 4  extremities.  A CT scan of the head done prior to transfer reportedly  showed no evidence of an acute stroke.  Neurology was asked see this  patient for the new-onset blindness.   PAST MEDICAL HISTORY:  Significant for:  1. History of sepsis with severe acidosis, hypotension.  2. New-onset blindness as above.  3. Acute renal failure.  4. Anemia.  5. Diabetes.  6. Hypertension.  7. Partial hysterectomy.  8. Gallbladder resection.  9. Left cataract surgery.  10.Cerebral aneurysm resection in 1993.  11.COPD.   The patient drinks beer on occasion, quit smoking in October 2008, and  has no known allergies.   SOCIAL HISTORY:  This patient is divorced, lives in the [place], [place] area, has 2 children, but 1 son was killed.  The patient has a  surviving daughter.   FAMILY MEDICAL HISTORY:  Notable for mother died with stroke.  Father  died with an MI.  The patient had 9 siblings, 4 sisters, 5 brothers all  of which have passed away, 3 brothers died with cancer, 1 died with an  MI, 1 died with motor vehicle accident.   REVIEW OF SYSTEMS:  Difficult to obtain at this point.  The patient has  not been complaining of headache, numbness or weakness of arms, legs  prior to intubation.   PHYSICAL EXAMINATION:  VITAL SIGNS :  Blood pressure currently is  100/40, heart rate is around 100.  The patient is sleepy, but can be  aroused; at the time of examination, intubated.  HEENT:  Head is atraumatic.  Eyes:  Pupil on the left is oblong,  postsurgical; pupil on the right is round.  Neither pupils react to  light.  Disks are difficult to visualize, but appear to be flat.  The  patient has full extraocular movements.  NECK:  Supple.  No carotid bruits noted.  RESPIRATORY:  Occasional rhonchi.  CARDIOVASCULAR:  A grade 2/6 systolic ejection murmur in the aortic  area.  EXTREMITIES:  Without significant edema.  NEUROLOGIC:  Cranial nerves as above.  Good facial symmetry is seen, but  again, the patient is able to cut her eyes laterally and superiorly and  inferiorly fairly well.  The patient claims that she can see some light  and larger objects.  The patient is able to communicate with head nods.  The patient reports good pinprick sensation bilaterally in the face and  once again has symmetric grimace.  The patient had a diffuse weakness,  was able to move all 4s fairly symmetrically.  The patient has  depressed, but symmetric reflexes.  Toes are neutral bilaterally.  The  patient has ability to sense pinprick sensation in all 4s.  Cerebellar  testing could not be  done.  The patient could not be ambulated.   LABORATORY VALUES:  Notable for sodium 140, potassium 3.2, chloride of  100, bicarb of 6, glucose of 329, BUN of 59, creatinine of 7.69, calcium  of 6.2, total protein of 5.2, albumin of 2.3, AST of 40, ALT of 16, LDH  of 321, total bili 0.9, amylase of 86, lipase 24, lactate of 15.5,  troponin-I of 0.38, CK-MB of 15.1 and CK 135; hemoglobin 9.9, hematocrit  30, and white count 25.9.   IMPRESSION:  1. History of sepsis with hypotension and anemia, severe acidosis.  2. Acute renal failure.  3. New-onset blindness.   This patient has had decrease in vision acutely, but at this point  appears to have some perception of light and larger objects and may have  some return in vision.  Pupils are still not reactive, and for this  reason, need to consider dysfunction of the anterior visual pathway.  In  the setting of sepsis, anterior ischemic optic neuropathy does need to  be considered.  The patient has risk factors as well for watershed-type  infarct, but this is likely not the cause of her blindness, if she has  sustained a stroke.   PLAN:  1. CT scan of the brain will be done.  2. We would recommend an ophthalmology consult at some point.  3. Supportive care and maintenance of blood pressure and prevention of      severe anemia may improve optic nerve function.  We will need to      look for some return of vision.  The visual deficit appears to have      been picked up very early in the process of her sepsis.  We will      follow the patient's clinical course while in-house.      Marlan Palau, M.D.  Electronically Signed     CKW/MEDQ  D:  08/08/2007  T:  08/09/2007  Job:  [id number]   cc:   [facility]  Erskine Speed, M.D.

## 2010-07-26 ENCOUNTER — Other Ambulatory Visit (HOSPITAL_COMMUNITY): Payer: Self-pay | Admitting: Internal Medicine

## 2010-07-26 DIAGNOSIS — Z1231 Encounter for screening mammogram for malignant neoplasm of breast: Secondary | ICD-10-CM

## 2010-08-03 ENCOUNTER — Ambulatory Visit (HOSPITAL_COMMUNITY): Payer: Self-pay

## 2010-08-29 ENCOUNTER — Ambulatory Visit (HOSPITAL_COMMUNITY): Payer: Medicare Other

## 2010-11-17 LAB — POCT I-STAT 7, (LYTES, BLD GAS, ICA,H+H)
Bicarbonate: 25.2 — ABNORMAL HIGH
Bicarbonate: 28.5 — ABNORMAL HIGH
Calcium, Ion: 0.83 — ABNORMAL LOW
Calcium, Ion: 1 — ABNORMAL LOW
HCT: 30 — ABNORMAL LOW
HCT: 32 — ABNORMAL LOW
Hemoglobin: 10.2 — ABNORMAL LOW
Hemoglobin: 10.2 — ABNORMAL LOW
Hemoglobin: 10.5 — ABNORMAL LOW
Hemoglobin: 10.5 — ABNORMAL LOW
Hemoglobin: 10.9 — ABNORMAL LOW
Hemoglobin: 9.9 — ABNORMAL LOW
O2 Saturation: 97
O2 Saturation: 99
O2 Saturation: 99
O2 Saturation: 99
Operator id: 100221
Operator id: 260421
Patient temperature: 37.4
Patient temperature: 96.6
Patient temperature: 97.4
Patient temperature: 97.7
Potassium: 2.8 — ABNORMAL LOW
Potassium: 3.9
Potassium: 4
Potassium: 4.1
Potassium: 4.2
Sodium: 138
Sodium: 140
TCO2: 12
TCO2: 26
TCO2: 30
TCO2: 30
pCO2 arterial: 18.7 — CL
pCO2 arterial: 34.7 — ABNORMAL LOW
pCO2 arterial: 34.8 — ABNORMAL LOW
pCO2 arterial: 35.4
pCO2 arterial: 37.4
pH, Arterial: 7.38
pH, Arterial: 7.46 — ABNORMAL HIGH
pH, Arterial: 7.492 — ABNORMAL HIGH
pH, Arterial: 7.52 — ABNORMAL HIGH
pH, Arterial: 7.541 — ABNORMAL HIGH
pH, Arterial: 7.546 — ABNORMAL HIGH
pO2, Arterial: 105 — ABNORMAL HIGH
pO2, Arterial: 115 — ABNORMAL HIGH
pO2, Arterial: 118 — ABNORMAL HIGH
pO2, Arterial: 88

## 2010-11-17 LAB — POCT I-STAT EG7
Acid-Base Excess: 2
Acid-Base Excess: 7 — ABNORMAL HIGH
Bicarbonate: 26.8 — ABNORMAL HIGH
Bicarbonate: 32.4 — ABNORMAL HIGH
Calcium, Ion: 0.54 — ABNORMAL LOW
Calcium, Ion: 0.54 — ABNORMAL LOW
Calcium, Ion: 0.55 — ABNORMAL LOW
HCT: 32 — ABNORMAL LOW
HCT: 35 — ABNORMAL LOW
Hemoglobin: 10.9 — ABNORMAL LOW
Hemoglobin: 11.9 — ABNORMAL LOW
O2 Saturation: 59
O2 Saturation: 78
O2 Saturation: 80
O2 Saturation: 81
Operator id: 260421
Operator id: 260421
Operator id: 260421
Patient temperature: 37.4
Patient temperature: 96.6
Potassium: 3.9
Potassium: 4
Potassium: 4.1
Sodium: 141
Sodium: 142
Sodium: 143
pCO2, Ven: 43.8 — ABNORMAL LOW
pCO2, Ven: 47
pH, Ven: 7.389 — ABNORMAL HIGH
pH, Ven: 7.45 — ABNORMAL HIGH
pO2, Ven: 40
pO2, Ven: 41

## 2010-11-17 LAB — RENAL FUNCTION PANEL
Albumin: 2.1 — ABNORMAL LOW
Albumin: 2.4 — ABNORMAL LOW
BUN: 16
BUN: 27 — ABNORMAL HIGH
CO2: 19
CO2: 27
CO2: 30
Calcium: 8.4
Calcium: 9
Chloride: 107
Creatinine, Ser: 1.33 — ABNORMAL HIGH
Creatinine, Ser: 4 — ABNORMAL HIGH
GFR calc Af Amer: 13 — ABNORMAL LOW
GFR calc Af Amer: 35 — ABNORMAL LOW
GFR calc Af Amer: 47 — ABNORMAL LOW
GFR calc non Af Amer: 29 — ABNORMAL LOW
GFR calc non Af Amer: 39 — ABNORMAL LOW
Glucose, Bld: 159 — ABNORMAL HIGH
Glucose, Bld: 82
Phosphorus: 3.4
Phosphorus: 3.4
Potassium: 4.4
Sodium: 138

## 2010-11-17 LAB — BASIC METABOLIC PANEL
BUN: 12
BUN: 124 — ABNORMAL HIGH
BUN: 14
BUN: 20
BUN: 31 — ABNORMAL HIGH
BUN: 36 — ABNORMAL HIGH
BUN: 44 — ABNORMAL HIGH
BUN: 6
BUN: 65 — ABNORMAL HIGH
BUN: 23
BUN: 25 — ABNORMAL HIGH
BUN: 26 — ABNORMAL HIGH
BUN: 26 — ABNORMAL HIGH
BUN: 28 — ABNORMAL HIGH
BUN: 28 — ABNORMAL HIGH
CO2: 11 — ABNORMAL LOW
CO2: 23
CO2: 30
CO2: 31
CO2: 31
CO2: 33 — ABNORMAL HIGH
CO2: 35 — ABNORMAL HIGH
CO2: 36 — ABNORMAL HIGH
CO2: 23
CO2: 26
CO2: 26
CO2: 29
CO2: 29
CO2: 30
Calcium: 6.5 — ABNORMAL LOW
Calcium: 6.9 — ABNORMAL LOW
Calcium: 7.1 — ABNORMAL LOW
Calcium: 7.5 — ABNORMAL LOW
Calcium: 7.8 — ABNORMAL LOW
Calcium: 7.9 — ABNORMAL LOW
Calcium: 8 — ABNORMAL LOW
Calcium: 8.2 — ABNORMAL LOW
Calcium: 8.6
Calcium: 9
Calcium: 6.9 — ABNORMAL LOW
Calcium: 7.3 — ABNORMAL LOW
Calcium: 7.4 — ABNORMAL LOW
Calcium: 7.6 — ABNORMAL LOW
Calcium: 7.6 — ABNORMAL LOW
Calcium: 7.8 — ABNORMAL LOW
Chloride: 102
Chloride: 103
Chloride: 103
Chloride: 104
Chloride: 104
Chloride: 105
Chloride: 105
Chloride: 106
Chloride: 107
Chloride: 107
Chloride: 96
Chloride: 101
Chloride: 103
Chloride: 106
Creatinine, Ser: 1.34 — ABNORMAL HIGH
Creatinine, Ser: 2.02 — ABNORMAL HIGH
Creatinine, Ser: 2.32 — ABNORMAL HIGH
Creatinine, Ser: 2.63 — ABNORMAL HIGH
Creatinine, Ser: 3.15 — ABNORMAL HIGH
Creatinine, Ser: 4 — ABNORMAL HIGH
Creatinine, Ser: 1.13
Creatinine, Ser: 1.19
Creatinine, Ser: 1.26 — ABNORMAL HIGH
Creatinine, Ser: 1.35 — ABNORMAL HIGH
Creatinine, Ser: 1.49 — ABNORMAL HIGH
Creatinine, Ser: 1.51 — ABNORMAL HIGH
Creatinine, Ser: 1.65 — ABNORMAL HIGH
GFR calc Af Amer: 16 — ABNORMAL LOW
GFR calc Af Amer: 19 — ABNORMAL LOW
GFR calc Af Amer: 21 — ABNORMAL LOW
GFR calc Af Amer: 25 — ABNORMAL LOW
GFR calc Af Amer: 6 — ABNORMAL LOW
GFR calc Af Amer: 41 — ABNORMAL LOW
GFR calc non Af Amer: 14 — ABNORMAL LOW
GFR calc non Af Amer: 16 — ABNORMAL LOW
GFR calc non Af Amer: 18 — ABNORMAL LOW
GFR calc non Af Amer: 20 — ABNORMAL LOW
GFR calc non Af Amer: 22 — ABNORMAL LOW
GFR calc non Af Amer: 5 — ABNORMAL LOW
GFR calc non Af Amer: 6 — ABNORMAL LOW
GFR calc non Af Amer: 30 — ABNORMAL LOW
GFR calc non Af Amer: 36 — ABNORMAL LOW
GFR calc non Af Amer: 41 — ABNORMAL LOW
GFR calc non Af Amer: 44 — ABNORMAL LOW
GFR calc non Af Amer: 47 — ABNORMAL LOW
Glucose, Bld: 109 — ABNORMAL HIGH
Glucose, Bld: 124 — ABNORMAL HIGH
Glucose, Bld: 125 — ABNORMAL HIGH
Glucose, Bld: 140 — ABNORMAL HIGH
Glucose, Bld: 146 — ABNORMAL HIGH
Glucose, Bld: 184 — ABNORMAL HIGH
Glucose, Bld: 186 — ABNORMAL HIGH
Glucose, Bld: 227 — ABNORMAL HIGH
Glucose, Bld: 274 — ABNORMAL HIGH
Glucose, Bld: 281 — ABNORMAL HIGH
Glucose, Bld: 87
Glucose, Bld: 97
Glucose, Bld: 111 — ABNORMAL HIGH
Glucose, Bld: 128 — ABNORMAL HIGH
Glucose, Bld: 130 — ABNORMAL HIGH
Glucose, Bld: 338 — ABNORMAL HIGH
Glucose, Bld: 409 — ABNORMAL HIGH
Glucose, Bld: 691
Glucose, Bld: 78
Glucose, Bld: 98
Potassium: 2.8 — ABNORMAL LOW
Potassium: 2.8 — ABNORMAL LOW
Potassium: 3 — ABNORMAL LOW
Potassium: 3.1 — ABNORMAL LOW
Potassium: 3.3 — ABNORMAL LOW
Potassium: 3.5
Potassium: 3.5
Potassium: 3.6
Potassium: 4.2
Potassium: 2.9 — ABNORMAL LOW
Potassium: 4.2
Sodium: 134 — ABNORMAL LOW
Sodium: 137
Sodium: 138
Sodium: 139
Sodium: 142
Sodium: 142
Sodium: 143
Sodium: 144
Sodium: 145
Sodium: 135
Sodium: 138
Sodium: 141
Sodium: 142
Sodium: 146 — ABNORMAL HIGH

## 2010-11-17 LAB — COMPREHENSIVE METABOLIC PANEL
ALT: 15
AST: 47 — ABNORMAL HIGH
Albumin: 2.3 — ABNORMAL LOW
Albumin: 2.3 — ABNORMAL LOW
Alkaline Phosphatase: 40
Alkaline Phosphatase: 43
Alkaline Phosphatase: 46
BUN: 13
BUN: 59 — ABNORMAL HIGH
CO2: 6 — CL
Chloride: 100
Chloride: 101
Chloride: 98
Creatinine, Ser: 7.69 — ABNORMAL HIGH
GFR calc Af Amer: 9 — ABNORMAL LOW
GFR calc non Af Amer: 20 — ABNORMAL LOW
GFR calc non Af Amer: 5 — ABNORMAL LOW
GFR calc non Af Amer: 7 — ABNORMAL LOW
Glucose, Bld: 131 — ABNORMAL HIGH
Glucose, Bld: 329 — ABNORMAL HIGH
Potassium: 3 — ABNORMAL LOW
Potassium: 3.2 — ABNORMAL LOW
Potassium: 4
Sodium: 137
Total Bilirubin: 0.7
Total Bilirubin: 0.9
Total Bilirubin: 0.9
Total Protein: 5 — ABNORMAL LOW

## 2010-11-17 LAB — POCT I-STAT 3, ART BLOOD GAS (G3+)
Acid-Base Excess: 7 — ABNORMAL HIGH
Bicarbonate: 21.9
Bicarbonate: 29.9 — ABNORMAL HIGH
Bicarbonate: 6.6 — ABNORMAL LOW
Bicarbonate: 7.6 — ABNORMAL LOW
O2 Saturation: 97
O2 Saturation: 99
Operator id: 100221
Operator id: 118361
Operator id: 126791
Patient temperature: 41.09
Patient temperature: 93
Patient temperature: 98.7
Patient temperature: 98.7
TCO2: 23
TCO2: 31
pCO2 arterial: 22.2 — ABNORMAL LOW
pCO2 arterial: 24.9 — ABNORMAL LOW
pCO2 arterial: 30.9 — ABNORMAL LOW
pH, Arterial: 7.058 — CL
pH, Arterial: 7.226 — ABNORMAL LOW
pH, Arterial: 7.466 — ABNORMAL HIGH
pH, Arterial: 7.601
pO2, Arterial: 126 — ABNORMAL HIGH
pO2, Arterial: 188 — ABNORMAL HIGH

## 2010-11-17 LAB — CBC
HCT: 27.2 — ABNORMAL LOW
HCT: 28.6 — ABNORMAL LOW
HCT: 29.3 — ABNORMAL LOW
HCT: 29.6 — ABNORMAL LOW
HCT: 30 — ABNORMAL LOW
HCT: 30 — ABNORMAL LOW
HCT: 30.2 — ABNORMAL LOW
HCT: 32.4 — ABNORMAL LOW
HCT: 33.4 — ABNORMAL LOW
HCT: 26.6 — ABNORMAL LOW
HCT: 27.5 — ABNORMAL LOW
Hemoglobin: 10.1 — ABNORMAL LOW
Hemoglobin: 10.1 — ABNORMAL LOW
Hemoglobin: 10.1 — ABNORMAL LOW
Hemoglobin: 11.2 — ABNORMAL LOW
Hemoglobin: 9.1 — ABNORMAL LOW
Hemoglobin: 9.4 — ABNORMAL LOW
Hemoglobin: 8.9 — ABNORMAL LOW
Hemoglobin: 9.3 — ABNORMAL LOW
Hemoglobin: 9.4 — ABNORMAL LOW
MCHC: 33
MCHC: 33.1
MCHC: 33.3
MCHC: 33.6
MCHC: 34.1
MCHC: 34.5
MCHC: 34.7
MCHC: 33.4
MCHC: 34.2
MCV: 88.7
MCV: 89.3
MCV: 89.4
MCV: 89.6
MCV: 90
MCV: 90.1
MCV: 90.6
MCV: 91.8
MCV: 88.9
Platelets: 163
Platelets: 166
Platelets: 181
Platelets: 189
Platelets: 193
Platelets: 213
Platelets: 214
Platelets: 235
Platelets: 240
Platelets: 274
Platelets: 207
Platelets: 243
RBC: 3.04 — ABNORMAL LOW
RBC: 3.31 — ABNORMAL LOW
RDW: 14.1
RDW: 14.1
RDW: 14.1
RDW: 14.2
RDW: 14.3
RDW: 14.5
RDW: 14.6
RDW: 14.7
RDW: 14.3
RDW: 14.4
RDW: 14.9
WBC: 11.3 — ABNORMAL HIGH
WBC: 23.3 — ABNORMAL HIGH
WBC: 25.7 — ABNORMAL HIGH
WBC: 25.9 — ABNORMAL HIGH
WBC: 33.1 — ABNORMAL HIGH

## 2010-11-17 LAB — URINE MICROSCOPIC-ADD ON

## 2010-11-17 LAB — MAGNESIUM
Magnesium: 1.5
Magnesium: 1.5
Magnesium: 1.9
Magnesium: 2
Magnesium: 2.1
Magnesium: 2.3

## 2010-11-17 LAB — URINALYSIS, ROUTINE W REFLEX MICROSCOPIC
Bilirubin Urine: NEGATIVE
Glucose, UA: 100 — AB
Glucose, UA: 100 — AB
Ketones, ur: 15 — AB
Protein, ur: 100 — AB
Protein, ur: 30 — AB

## 2010-11-17 LAB — GLUCOSE, RANDOM: Glucose, Bld: 575

## 2010-11-17 LAB — DIFFERENTIAL
Basophils Absolute: 0
Eosinophils Relative: 0
Lymphs Abs: 0.8
Monocytes Absolute: 1.3 — ABNORMAL HIGH
Monocytes Relative: 5
Neutro Abs: 23.8 — ABNORMAL HIGH

## 2010-11-17 LAB — APTT
aPTT: 109 — ABNORMAL HIGH
aPTT: 25
aPTT: 28
aPTT: 32

## 2010-11-17 LAB — AMYLASE, BODY FLUID: Amylase, Fluid: 6376

## 2010-11-17 LAB — CLOSTRIDIUM DIFFICILE EIA
C difficile Toxins A+B, EIA: 25
C difficile Toxins A+B, EIA: NEGATIVE

## 2010-11-17 LAB — CARBOXYHEMOGLOBIN
Carboxyhemoglobin: 0.6
Carboxyhemoglobin: 0.7
Carboxyhemoglobin: 0.7
Methemoglobin: 0.6
Methemoglobin: 1
Methemoglobin: 1
Methemoglobin: 1.1
O2 Saturation: 68.1
O2 Saturation: 77.6
Total hemoglobin: 10.3 — ABNORMAL LOW
Total hemoglobin: 9.4 — ABNORMAL LOW

## 2010-11-17 LAB — CULTURE, BLOOD (ROUTINE X 2)
Culture: NO GROWTH
Culture: NO GROWTH

## 2010-11-17 LAB — CORTISOL: Cortisol, Plasma: 24.5

## 2010-11-17 LAB — PROTIME-INR
INR: 1.4
INR: 1.4
Prothrombin Time: 15.4 — ABNORMAL HIGH
Prothrombin Time: 17 — ABNORMAL HIGH
Prothrombin Time: 17.7 — ABNORMAL HIGH

## 2010-11-17 LAB — TYPE AND SCREEN

## 2010-11-17 LAB — CARDIAC PANEL(CRET KIN+CKTOT+MB+TROPI)
Total CK: 135
Total CK: 34
Troponin I: 0.38 — ABNORMAL HIGH

## 2010-11-17 LAB — PHOSPHORUS
Phosphorus: 2.7
Phosphorus: 3.1
Phosphorus: 3.2

## 2010-11-17 LAB — FOLATE RBC: RBC Folate: 456

## 2010-11-17 LAB — VITAMIN B1: Vitamin B1 (Thiamine): 9 nmol/L (ref 9–44)

## 2010-11-17 LAB — LACTIC ACID, PLASMA: Lactic Acid, Venous: 15.5 — ABNORMAL HIGH

## 2010-11-17 LAB — URINE CULTURE
Colony Count: NO GROWTH
Culture: NO GROWTH

## 2010-11-17 LAB — HEMOGLOBIN AND HEMATOCRIT, BLOOD
HCT: 31.8 — ABNORMAL LOW
Hemoglobin: 10 — ABNORMAL LOW
Hemoglobin: 10.9 — ABNORMAL LOW

## 2010-11-17 LAB — CEA (CARCINOEMBRYONIC ANTIGEN), FLUID: CEA Fluid: 15 not reported

## 2010-11-17 LAB — VITAMIN B12: Vitamin B-12: 203 — ABNORMAL LOW (ref 211–911)

## 2010-11-17 LAB — LIPASE, BLOOD
Lipase: 11
Lipase: 14

## 2010-11-17 LAB — PREPARE RBC (CROSSMATCH)

## 2011-09-21 ENCOUNTER — Encounter: Payer: Self-pay | Admitting: Vascular Surgery

## 2012-03-29 ENCOUNTER — Other Ambulatory Visit: Payer: Self-pay | Admitting: *Deleted

## 2012-03-29 DIAGNOSIS — I739 Peripheral vascular disease, unspecified: Secondary | ICD-10-CM

## 2012-03-29 DIAGNOSIS — Z48812 Encounter for surgical aftercare following surgery on the circulatory system: Secondary | ICD-10-CM

## 2012-04-08 ENCOUNTER — Encounter: Payer: Self-pay | Admitting: Neurosurgery

## 2012-04-09 ENCOUNTER — Encounter: Payer: Self-pay | Admitting: Neurosurgery

## 2012-04-10 ENCOUNTER — Ambulatory Visit: Payer: Medicare Other | Admitting: Neurosurgery

## 2013-04-21 ENCOUNTER — Other Ambulatory Visit: Payer: Self-pay | Admitting: Internal Medicine

## 2013-04-21 DIAGNOSIS — N6459 Other signs and symptoms in breast: Secondary | ICD-10-CM

## 2013-05-01 ENCOUNTER — Other Ambulatory Visit: Payer: Medicare Other

## 2013-05-08 ENCOUNTER — Ambulatory Visit
Admission: RE | Admit: 2013-05-08 | Discharge: 2013-05-08 | Disposition: A | Discharge status: Home / Self Care | Payer: Medicare Other | Source: Ambulatory Visit | Attending: Internal Medicine | Admitting: Internal Medicine

## 2013-05-08 DIAGNOSIS — N6459 Other signs and symptoms in breast: Secondary | ICD-10-CM

## 2014-06-23 ENCOUNTER — Other Ambulatory Visit: Payer: Self-pay

## 2014-06-23 DIAGNOSIS — Z1231 Encounter for screening mammogram for malignant neoplasm of breast: Secondary | ICD-10-CM

## 2014-07-07 ENCOUNTER — Ambulatory Visit
Admission: RE | Admit: 2014-07-07 | Discharge: 2014-07-07 | Disposition: A | Discharge status: Home / Self Care | Payer: Medicare Other | Source: Ambulatory Visit

## 2014-07-07 ENCOUNTER — Ambulatory Visit: Payer: Medicare Other

## 2014-07-07 DIAGNOSIS — Z1231 Encounter for screening mammogram for malignant neoplasm of breast: Secondary | ICD-10-CM

## 2014-09-15 ENCOUNTER — Encounter: Payer: Self-pay | Admitting: Gastroenterology

## 2014-09-24 ENCOUNTER — Encounter: Payer: Self-pay | Admitting: Gastroenterology

## 2014-09-24 ENCOUNTER — Ambulatory Visit (INDEPENDENT_AMBULATORY_CARE_PROVIDER_SITE_OTHER): Payer: Medicare Other | Admitting: Gastroenterology

## 2014-09-24 VITALS — BP 126/60 | HR 62 | Ht 63.0 in | Wt 99.0 lb

## 2014-09-24 DIAGNOSIS — D649 Anemia, unspecified: Secondary | ICD-10-CM | POA: Diagnosis not present

## 2014-09-24 DIAGNOSIS — R195 Other fecal abnormalities: Secondary | ICD-10-CM | POA: Insufficient documentation

## 2014-09-24 DIAGNOSIS — Z800 Family history of malignant neoplasm of digestive organs: Secondary | ICD-10-CM | POA: Insufficient documentation

## 2014-09-24 MED ORDER — NA SULFATE-K SULFATE-MG SULF 17.5-3.13-1.6 GM/177ML PO SOLN: ORAL | Status: DC

## 2014-09-24 NOTE — Progress Notes (Signed)
09/24/2014 Chelsey Bishop [id number] Chelsey Bishop 30, 1931   HISTORY OF PRESENT ILLNESS:  This is an 81 year old female who was previously seen by Dr. [name] in 2009 and then underwent EUS by Dr. [name] for evaluation of a pancreatic cyst.  Patient has not been seen since that time.  Has PMH of HTN, DM, PVD, and COPD/emphysema not on O2.  Is fairly active.  Has never undergone colonoscopy.  She is here today at the request of her PCP, Dr. [name], because of heme positive stools found on routine physical exam recently.  Hgb is 110. Grams.  Was 11.6 grams in 05/2014.  MCV is normal at 95.7.  Patient denies seeing blood in her stools or black stools.  She does have a family history of colon cancer in her brother.  Says that BM's are regular.  No abdominal pain, heartburn, reflux, indigestion, etc.  Takes an ASA 81 mg daily but no other blood thinners or anti-platelets.   Past Medical History  Diagnosis Date  . Anemia   . Hypertension   . Varicose veins   . Arthritis   . Diabetes mellitus without complication   . PVD (peripheral vascular disease) with claudication   . Cerebral aneurysm 1993    intracranial hemorrhage  . DJD (degenerative joint disease)   . COPD (chronic obstructive pulmonary disease)   . Pancreatic cyst   . Thyroid nodule   . Emphysema lung    Past Surgical History  Procedure Laterality Date  . Pr vein bypass graft,aorto-fem-pop  1999    reports that she quit smoking about 7 years ago. Her smoking use included Cigarettes. She has never used smokeless tobacco. She reports that she drinks alcohol. Her drug history is not on file. family history includes Cancer in her brother; Heart disease in her brother, father, and sister; Stroke in her mother and sister. No Known Allergies    Outpatient Encounter Prescriptions as of 09/24/2014  Medication Sig  . aspirin 81 MG tablet Take 81 mg by mouth daily.  Marland Kitchen glimepiride (AMARYL) 4 MG tablet Take 2 mg by mouth daily with breakfast.  .  lisinopril (PRINIVIL,ZESTRIL) 40 MG tablet Take 40 mg by mouth daily.  . metformin (FORTAMET) 1000 MG (OSM) 24 hr tablet Take 1,000 mg by mouth daily with breakfast.  . metFORMIN (GLUCOPHAGE) 1000 MG tablet Take 1,000 mg by mouth 2 (two) times daily with a meal. Takes 1 1/2 in am and 1 at night  . Metoprolol Tartrate (LOPRESSOR PO) Take 1 tablet by mouth 2 (two) times daily.  Marland Kitchen NIFEdipine (PROCARDIA XL/ADALAT-CC) 60 MG 24 hr tablet Take 60 mg by mouth daily.  . Simvastatin (ZOCOR PO) Take 1 tablet by mouth daily.  . Na Sulfate-K Sulfate-Mg Sulf SOLN Use as directed per Colonoscopy.   No facility-administered encounter medications on file as of 09/24/2014.     REVIEW OF SYSTEMS  : All other systems reviewed and negative except where noted in the History of Present Illness.   PHYSICAL EXAM: BP 126/60 mmHg  Pulse 62  Ht 5\' 3"  (1.6 m)  Wt 99 lb (44.906 kg)  BMI 17.54 kg/m2  SpO2 97% General:  Thin elderly white female in no acute distress Head: Normocephalic and atraumatic Eyes:  Sclerae anicteric, conjunctiva pink. Ears: Normal auditory acuity Lungs: Clear throughout to auscultation, but decreased BS noted on the left. Heart: Regular rate and rhythm Abdomen: Soft, non-distended.  Normal bowel sounds.  Non-tender. Musculoskeletal: Symmetrical with no gross deformities  Skin: No lesions on visible extremities Extremities: No edema  Neurological: Alert oriented x 4, grossly non-focal Psychological:  Alert and cooperative. Normal mood and affect  ASSESSMENT AND PLAN: -Heme positive stools with mild normocytic anemia:  Never had colonoscopy.  Hgb 11.0 grams.  No overt bleeding.  Will schedule for EGD and colonoscopy to rule out source of occult bleeding.  The risks, benefits, and alternatives to EGD and colonoscopy were discussed with the patient and she consents to proceed. -Family history of colon cancer in a brother  CC:  Dr. [name]

## 2014-09-24 NOTE — Patient Instructions (Signed)
You have been scheduled for an endoscopy and colonoscopy. Please follow the written instructions given to you at your visit today. Please pick up your prep supplies at the pharmacy within the next 1-3 days. If you use inhalers (even only as needed), please bring them with you on the day of your procedure. Your physician has requested that you go to www.startemmi.com and enter the access code given to you at your visit today. This web site gives a general overview about your procedure. However, you should still follow specific instructions given to you by our office regarding your preparation for the procedure.  We have sent medications to your pharmacy for you to pick up at your convenience.

## 2014-09-25 NOTE — Progress Notes (Signed)
Reviewed and agree with management. Robert D. Kaplan, M.D., FACG  

## 2014-10-05 ENCOUNTER — Encounter: Payer: Self-pay | Admitting: Internal Medicine

## 2014-11-09 ENCOUNTER — Encounter: Payer: Self-pay | Admitting: Gastroenterology

## 2014-11-09 ENCOUNTER — Ambulatory Visit (AMBULATORY_SURGERY_CENTER): Payer: Medicare Other | Admitting: Gastroenterology

## 2014-11-09 VITALS — BP 132/69 | HR 63 | Temp 97.1°F | Resp 22 | Ht 63.0 in | Wt 99.0 lb

## 2014-11-09 DIAGNOSIS — R195 Other fecal abnormalities: Secondary | ICD-10-CM | POA: Diagnosis not present

## 2014-11-09 DIAGNOSIS — D120 Benign neoplasm of cecum: Secondary | ICD-10-CM

## 2014-11-09 DIAGNOSIS — K3189 Other diseases of stomach and duodenum: Secondary | ICD-10-CM

## 2014-11-09 DIAGNOSIS — K253 Acute gastric ulcer without hemorrhage or perforation: Secondary | ICD-10-CM

## 2014-11-09 DIAGNOSIS — D649 Anemia, unspecified: Secondary | ICD-10-CM

## 2014-11-09 LAB — GLUCOSE, CAPILLARY
GLUCOSE-CAPILLARY: 163 mg/dL — AB (ref 65–99)
Glucose-Capillary: 154 mg/dL — ABNORMAL HIGH (ref 65–99)

## 2014-11-09 MED ORDER — SODIUM CHLORIDE 0.9 % IV SOLN: 500.0000 mL | INTRAVENOUS | Status: DC

## 2014-11-09 NOTE — Progress Notes (Signed)
Report to PACU, RN, vss, BBS= Clear.  

## 2014-11-09 NOTE — Progress Notes (Signed)
Called to room to assist during endoscopic procedure.  Patient ID and intended procedure confirmed with present staff. Received instructions for my participation in the procedure from the performing physician.  

## 2014-11-09 NOTE — Op Note (Signed)
[place]  [address] [place], [id number]   COLONOSCOPY PROCEDURE REPORT  PATIENT: Chelsey Bishop, Chelsey Bishop  MR#: [id number] BIRTHDATE: 01/30/1930 , 84  yrs. old GENDER: female ENDOSCOPIST: Inda Castle, MD REFERRED AO:ZHYQM [name], M.D. PROCEDURE DATE:  11/09/2014 PROCEDURE:   Colonoscopy, screening and Colonoscopy with biopsy First Screening Colonoscopy - Avg.  risk and is 50 yrs.  old or older Yes.  Prior Negative Screening - Now for repeat screening. N/A  History of Adenoma - Now for follow-up colonoscopy & has been > or = to 3 yrs.  N/A  Polyps removed today? Yes ASA CLASS:   Class II INDICATIONS:Colorectal Neoplasm Risk Assessment for this procedure is average risk and heme-positive stool. MEDICATIONS: Monitored anesthesia care and Propofol 100 mg IV  DESCRIPTION OF PROCEDURE:   After the risks benefits and alternatives of the procedure were thoroughly explained, informed consent was obtained.  The digital rectal exam revealed no abnormalities of the rectum.   The LB [id number]  endoscope was introduced through the anus and advanced to the cecum, which was identified by both the appendix and ileocecal valve. No adverse events experienced.   The quality of the prep was (Suprep was used) excellent.  The instrument was then slowly withdrawn as the colon was fully examined. Estimated blood loss is zero unless otherwise noted in this procedure report.      COLON FINDINGS: A flat polyp measuring 25 mm in size was found at the cecum.  Multiple biopsies were performed.  Sample was obtained and sent to histology.   The examination was otherwise normal. Retroflexed views revealed no abnormalities. The time to cecum = 4.2 Withdrawal time = 6.9   The scope was withdrawn and the procedure completed. COMPLICATIONS: There were no immediate complications.  ENDOSCOPIC IMPRESSION: 1.   Flat polyp was found at the cecum; multiple biopsies were performed 2.   The  examination was otherwise normal  RECOMMENDATIONS: Await biopsy results  eSigned:  Inda Castle, MD 11/09/2014 3:32 PM   cc:

## 2014-11-09 NOTE — Patient Instructions (Addendum)
Discharge instructions given. Handout on polyps. Follow up hemoccults in 4 weeks. Cards given in recovery. Biopsies taken. YOU HAD AN ENDOSCOPIC PROCEDURE TODAY AT [facility]:   Refer to the procedure report that was given to you for any specific questions about what was found during the examination.  If the procedure report does not answer your questions, please call your gastroenterologist to clarify.  If you requested that your care partner not be given the details of your procedure findings, then the procedure report has been included in a sealed envelope for you to review at your convenience later.  YOU SHOULD EXPECT: Some feelings of bloating in the abdomen. Passage of more gas than usual.  Walking can help get rid of the air that was put into your GI tract during the procedure and reduce the bloating. If you had a lower endoscopy (such as a colonoscopy or flexible sigmoidoscopy) you may notice spotting of blood in your stool or on the toilet paper. If you underwent a bowel prep for your procedure, you may not have a normal bowel movement for a few days.  Please Note:  You might notice some irritation and congestion in your nose or some drainage.  This is from the oxygen used during your procedure.  There is no need for concern and it should clear up in a day or so.  SYMPTOMS TO REPORT IMMEDIATELY:   Following lower endoscopy (colonoscopy or flexible sigmoidoscopy):  Excessive amounts of blood in the stool  Significant tenderness or worsening of abdominal pains  Swelling of the abdomen that is new, acute  Fever of 100F or higher   For urgent or emergent issues, a gastroenterologist can be reached at any hour by calling [phone].   DIET: Your first meal following the procedure should be a small meal and then it is ok to progress to your normal diet. Heavy or fried foods are harder to digest and may make you feel nauseous or bloated.  Likewise, meals heavy in  dairy and vegetables can increase bloating.  Drink plenty of fluids but you should avoid alcoholic beverages for 24 hours.  ACTIVITY:  You should plan to take it easy for the rest of today and you should NOT DRIVE or use heavy machinery until tomorrow (because of the sedation medicines used during the test).    FOLLOW UP: Our staff will call the number listed on your records the next business day following your procedure to check on you and address any questions or concerns that you may have regarding the information given to you following your procedure. If we do not reach you, we will leave a message.  However, if you are feeling well and you are not experiencing any problems, there is no need to return our call.  We will assume that you have returned to your regular daily activities without incident.  If any biopsies were taken you will be contacted by phone or by letter within the next 1-3 weeks.  Please call us at [phone] if you have not heard about the biopsies in 3 weeks.    SIGNATURES/CONFIDENTIALITY: You and/or your care partner have signed paperwork which will be entered into your electronic medical record.  These signatures attest to the fact that that the information above on your After Visit Summary has been reviewed and is understood.  Full responsibility of the confidentiality of this discharge information lies with you and/or your care-partner.

## 2014-11-09 NOTE — Op Note (Signed)
[place]  [address] [place], [address]     ENDOSCOPY PROCEDURE REPORT  PATIENT: Chelsey, Bishop  MR#: [id number] BIRTHDATE: 1929-12-07 , 84  yrs. old GENDER: female ENDOSCOPIST: Inda Castle, MD REFERRED BY:  Levin Erp, M.D. PROCEDURE DATE:  11/09/2014 PROCEDURE:  EGD w/ biopsy ASA CLASS:     Class II INDICATIONS:  occult blood positive. MEDICATIONS: Residual sedation present, Monitored anesthesia care, and Propofol 20 mg IV TOPICAL ANESTHETIC:  DESCRIPTION OF PROCEDURE: After the risks benefits and alternatives of the procedure were thoroughly explained, informed consent was obtained.  The LB [id number] endoscope was introduced through the mouth and advanced to the second portion of the duodenum , Without limitations.  The instrument was slowly withdrawn as the mucosa was fully examined.    STOMACH: A single non-bleeding, clean-based and shallow ulcer measuring 3 x 68mm in size was found on the greater curvature of the gastric body and in the gastric body. Ulcer was 80-90% healed. Biopsies were taken around the ulcer and at edge of the ulcer. Except for the findings listed, the EGD was otherwise normal. Retroflexed views revealed no abnormalities.     The scope was then withdrawn from the patient and the procedure completed.  COMPLICATIONS: There were no immediate complications.  ENDOSCOPIC IMPRESSION: almost completely healed gastric ulcer  findings could explain Hemoccult-positive stool  RECOMMENDATIONS: Await biopsy results begin Pepcid 40 mg daily Follow-up Hemoccults in 4 weeks  REPEAT EXAM:  eSigned:  Inda Castle, MD 11/09/2014 3:37 PM    CC:  PATIENT NAME:  Chelsey, Bishop MR#: [id number]

## 2014-11-10 ENCOUNTER — Telehealth: Payer: Self-pay

## 2014-11-10 MED ORDER — FAMOTIDINE 40 MG PO TABS: 40.0000 mg | ORAL_TABLET | Freq: Every day | ORAL | Status: DC

## 2014-11-10 NOTE — Telephone Encounter (Signed)
  Follow up Call-  Call back number 11/09/2014  Post procedure Call Back phone  # [phone]  Permission to leave phone message Yes     Patient questions:  Do you have a fever, pain , or abdominal swelling? No. Pain Score  0 *  Have you tolerated food without any problems? Yes.    Have you been able to return to your normal activities? Yes.    Do you have any questions about your discharge instructions: Diet   No. Medications  No. Follow up visit  No.  Do you have questions or concerns about your Care? No.  Actions: * If pain score is 4 or above: No action needed, pain <4.  I spoke with the pt's daughter, Lynelle Smoke.  She said as far as she knew her mother did ok.  I asked if she would let her know we called to check on her and to call if any questions or concerns.  MAW

## 2014-11-17 ENCOUNTER — Encounter: Payer: Self-pay | Admitting: Gastroenterology

## 2015-01-07 ENCOUNTER — Other Ambulatory Visit (INDEPENDENT_AMBULATORY_CARE_PROVIDER_SITE_OTHER): Payer: Medicare Other

## 2015-01-07 ENCOUNTER — Encounter: Payer: Self-pay | Admitting: Gastroenterology

## 2015-01-07 ENCOUNTER — Ambulatory Visit (INDEPENDENT_AMBULATORY_CARE_PROVIDER_SITE_OTHER): Payer: Medicare Other | Admitting: Gastroenterology

## 2015-01-07 VITALS — BP 138/70 | HR 86 | Ht 63.0 in | Wt 95.2 lb

## 2015-01-07 DIAGNOSIS — Z860100 Personal history of colon polyps, unspecified: Secondary | ICD-10-CM

## 2015-01-07 DIAGNOSIS — K259 Gastric ulcer, unspecified as acute or chronic, without hemorrhage or perforation: Secondary | ICD-10-CM

## 2015-01-07 DIAGNOSIS — D649 Anemia, unspecified: Secondary | ICD-10-CM | POA: Diagnosis not present

## 2015-01-07 LAB — CBC WITH DIFFERENTIAL/PLATELET
BASOS ABS: 0 10*3/uL (ref 0.0–0.1)
Basophils Relative: 0.3 % (ref 0.0–3.0)
EOS PCT: 1.4 % (ref 0.0–5.0)
Eosinophils Absolute: 0.2 10*3/uL (ref 0.0–0.7)
HEMATOCRIT: 38.5 % (ref 36.0–46.0)
Hemoglobin: 12.2 g/dL (ref 12.0–15.0)
LYMPHS ABS: 3.6 10*3/uL (ref 0.7–4.0)
LYMPHS PCT: 27.8 % (ref 12.0–46.0)
MCHC: 31.8 g/dL (ref 30.0–36.0)
MCV: 98.4 fl (ref 78.0–100.0)
MONOS PCT: 6.6 % (ref 3.0–12.0)
Monocytes Absolute: 0.8 10*3/uL (ref 0.1–1.0)
NEUTROS ABS: 8.2 10*3/uL — AB (ref 1.4–7.7)
NEUTROS PCT: 63.9 % (ref 43.0–77.0)
Platelets: 498 10*3/uL — ABNORMAL HIGH (ref 150.0–400.0)
RBC: 3.91 Mil/uL (ref 3.87–5.11)
RDW: 17.5 % — ABNORMAL HIGH (ref 11.5–15.5)
WBC: 12.8 10*3/uL — ABNORMAL HIGH (ref 4.0–10.5)

## 2015-01-07 LAB — COMPREHENSIVE METABOLIC PANEL
ALK PHOS: 78 U/L (ref 39–117)
ALT: 7 U/L (ref 0–35)
AST: 13 U/L (ref 0–37)
Albumin: 4 g/dL (ref 3.5–5.2)
BILIRUBIN TOTAL: 0.3 mg/dL (ref 0.2–1.2)
BUN: 14 mg/dL (ref 6–23)
CALCIUM: 9.4 mg/dL (ref 8.4–10.5)
CO2: 26 mEq/L (ref 19–32)
Chloride: 98 mEq/L (ref 96–112)
Creatinine, Ser: 0.62 mg/dL (ref 0.40–1.20)
GFR: 97.7 mL/min (ref >60.00)
GLUCOSE: 114 mg/dL — AB (ref 70–99)
POTASSIUM: 5.1 meq/L (ref 3.5–5.1)
Sodium: 140 mEq/L (ref 135–145)
TOTAL PROTEIN: 7.7 g/dL (ref 6.0–8.3)

## 2015-01-07 MED ORDER — OMEPRAZOLE 40 MG PO CPDR: 40.0000 mg | DELAYED_RELEASE_CAPSULE | Freq: Every day | ORAL | Status: AC

## 2015-01-07 MED ORDER — NA SULFATE-K SULFATE-MG SULF 17.5-3.13-1.6 GM/177ML PO SOLN: ORAL | Status: AC

## 2015-01-07 NOTE — Progress Notes (Signed)
HPI :  81 y/o female here for follow up. She is a former patient Dr. [name]. She has a history of DM, HTN, PVD, and COPD. Also with history of brain aneurysm and MI/CVA in 2009. She was previously referred for heme positive stools and underwent EGD and colonoscopy on September 16th. On EGD she had a "79mm x 42mm ulcer" of the gastric body which was biopsied. No evidence of H pylori however intestinal metaplasia noted with low grade dysplasia. Colonoscopy remarkable for a "11mm cecal polyp" which was not removed but biopsied and c/w adenoma. She denies abdominal pains at all. She does not eat well however, she has a poor appetite, and weight maintains between 90-100 lbs. She denies early satiety. Her weight fluctuates. She denies any bowel habit changes or blood in the stools. She had an EUS for a pancreatic cyst in 2009, she reports being septic following biopsy. She denies any other NSAIDs. No plavix. She is taking pepsid but no PPIs. She is not aware of biopsy results until today. Daughter reports the patient in functional, can ambulate on her own. Not limited by chest pains or shortness of breath.  Colonoscopy 11/09/14 - 2.5cm cecal polyp, c/w adenoma, not removed EGD - 3x109mm ulcer, biopsies with low grade dysplasia in setting of IM, H pylori negative   Past Medical History  Diagnosis Date  . Anemia   . Hypertension   . Varicose veins   . Arthritis   . Diabetes mellitus without complication ([place])   . PVD (peripheral vascular disease) with claudication ([place])   . Cerebral aneurysm 1993    intracranial hemorrhage  . DJD (degenerative joint disease)   . COPD (chronic obstructive pulmonary disease) ([place])   . Pancreatic cyst   . Thyroid nodule   . Emphysema lung ([place])   . Cataract 2005 and 2006    bilateral  . Hyperlipidemia   . Chronic kidney disease   . Myocardial infarction ([place]) 2009  . Stroke [facility])     2009     Past Surgical History  Procedure Laterality Date  . Pr vein bypass  graft,aorto-fem-pop  1999   Family History  Problem Relation Age of Onset  . Stroke Mother   . Heart disease Father   . Heart disease Sister   . Stroke Sister   . Cancer Brother   . Heart disease Brother   . Colon cancer Brother    Social History  Substance Use Topics  . Smoking status: Current Every Day Smoker    Types: Cigarettes    Last Attempt to Quit: 12/07/2006  . Smokeless tobacco: Never Used  . Alcohol Use: 0.0 oz/week    0 Standard drinks or equivalent per week   Current Outpatient Prescriptions  Medication Sig Dispense Refill  . aspirin 81 MG tablet Take 81 mg by mouth daily.    . famotidine (PEPCID) 40 MG tablet Take 1 tablet (40 mg total) by mouth daily. 30 tablet 2  . glimepiride (AMARYL) 4 MG tablet Take 2 mg by mouth daily with breakfast.    . lisinopril (PRINIVIL,ZESTRIL) 40 MG tablet Take 40 mg by mouth daily.    . metformin (FORTAMET) 1000 MG (OSM) 24 hr tablet Take 1,000 mg by mouth daily with breakfast.    . metFORMIN (GLUCOPHAGE) 1000 MG tablet Take 1,000 mg by mouth 2 (two) times daily with a meal. Takes 1 1/2 in am and 1 at night    . Metoprolol Tartrate (LOPRESSOR PO) Take 1 tablet  by mouth 2 (two) times daily.    Marland Kitchen NIFEdipine (PROCARDIA XL/ADALAT-CC) 60 MG 24 hr tablet Take 60 mg by mouth daily.    . Simvastatin (ZOCOR PO) Take 1 tablet by mouth daily.    . Na Sulfate-K Sulfate-Mg Sulf SOLN Take as directed per Colonoscopy. 354 mL 0  . omeprazole (PRILOSEC) 40 MG capsule Take 1 capsule (40 mg total) by mouth daily. 90 capsule 1   No current facility-administered medications for this visit.   No Known Allergies   Review of Systems: All systems reviewed and negative except where noted in HPI.   Lab Results  Component Value Date   WBC 12.8* 01/07/2015   HGB 12.2 01/07/2015   HCT 38.5 01/07/2015   MCV 98.4 01/07/2015   PLT 498.0* 01/07/2015    Lab Results  Component Value Date   ALT 7 01/07/2015   AST 13 01/07/2015   ALKPHOS 78 01/07/2015     BILITOT 0.3 01/07/2015   Lab Results  Component Value Date   CREATININE 0.62 01/07/2015   BUN 14 01/07/2015   NA 140 01/07/2015   K 5.1 01/07/2015   CL 98 01/07/2015   CO2 26 01/07/2015     Physical Exam: BP 138/70 mmHg  Pulse 86  Ht 5\' 3"  (1.6 m)  Wt 95 lb 3.2 oz (43.182 kg)  BMI 16.87 kg/m2  SpO2 85% Constitutional: Pleasant, thin appearing, female in no acute distress. HEENT: Normocephalic and atraumatic. Conjunctivae are normal. No scleral icterus. Neck supple.  Cardiovascular: Normal rate, regular rhythm.  Pulmonary/chest: Effort normal and breath sounds norma although somewhat decreased bilaterally. Abdominal: Soft, nondistended, nontender. Bowel sounds active throughout. There are no masses palpable. No hepatomegaly. Extremities: no edema Lymphadenopathy: No cervical adenopathy noted. Neurological: Alert and oriented to person place and time. Skin: Skin is warm and dry. No rashes noted. Psychiatric: Normal mood and affect. Behavior is normal.   ASSESSMENT AND PLAN: 81 y/o female with multiple comorbidities as outlined above who underwent an endoscopic evaluate for heme positive stools in September noted to have a gastric ulcer with intestinal metaplasia and low grade dysplasia, as well as a large cecal adenoma which was not removed. The patient and daughter report they were not aware of the pathology results until today.   The gastric ulcer with low grade dysplasia is concerning, and discussed risk of possible malignancy from this. The patient denies abdominal pain but does not eat well and remains underweight. She has not had abdominal imaging in recent years and given this finding and her weight loss recommend a CT abdomen to see if there is any abnormality concerning for malignancy associated with this. Otherwise, recommend she stop Pepsid and will place her on Omeprazole 40mg  daily. I will await CT results but ultimately she will warrant repeat EGD with biopsies, and  may consider EUS pending the CT results to see if this would be a lesion amenable to EMR if no evidence of malignancy but with dysplasia. I have otherwise ordered CBC and CMP today, she has no anemia but mild leukocytosis and elevated platelets suggesting active inflammation. Will await CT.    Regarding the cecal polyp, I discussed options to manage this which include endoscopic polypectomy, elective surgical colon resection, or observation given her age and comorbidities. We discussed the risks of each strategy. On review of colonoscopy report I do think it is amenable to endoscopic removal however discussed that with the location of this lesion in the cecum, there is increased risk for  bleeding and perforation. I discussed the risks of surgical options. They wished to have something done about this rather than observe as she is otherwise functional at this time. Following our discussion, while understanding the risks, they wish to try endoscopic polypectomy first. This will be scheduled to be done at the hospital given her comorbidities. Will await results.   [place] Cellar, MD [facility] Pager [phone]

## 2015-01-07 NOTE — Patient Instructions (Addendum)
You have been scheduled for a colonoscopy. Please follow written instructions given to you at your visit today.  Please pick up your prep supplies at the pharmacy within the next 1-3 days. If you use inhalers (even only as needed), please bring them with you on the day of your procedure.  Your physician has requested that you go to the basement for lab work before leaving today.  We will call you about scheduling and EGD after results of CT scan.  You have been scheduled for a CT scan of the abdomen and pelvis at [facility] ([address]---this is in the same building as [facility]).     You are scheduled on 01/13/2015 at 12:30pm. You should arrive 15 minutes prior to your appointment time for registration. Please follow the written instructions below on the day of your exam:  WARNING: IF YOU ARE ALLERGIC TO IODINE/X-RAY DYE, PLEASE NOTIFY RADIOLOGY IMMEDIATELY AT [phone]! YOU WILL BE GIVEN A 13 HOUR PREMEDICATION PREP.  1) Do not eat or drink anything after 8:30am (4 hours prior to your test) 2) You have been given 2 bottles of oral contrast to drink. The solution may taste               better if refrigerated, but do NOT add ice or any other liquid to this solution. Shake             well before drinking.    Drink 1 bottle of contrast @ 10:30am (2 hours prior to your exam)  Drink 1 bottle of contrast @ 11:30am (1 hour prior to your exam)  You may take any medications as prescribed with a small amount of water except for the following: Metformin, Glucophage, Glucovance, Avandamet, Riomet, Fortamet, Actoplus Met, Janumet, Glumetza or Metaglip. The above medications must be held the day of the exam AND 48 hours after the exam.  The purpose of you drinking the oral contrast is to aid in the visualization of your intestinal tract. The contrast solution may cause some diarrhea. Before your exam is started, you will be given a small amount of fluid to drink. Depending  on your individual set of symptoms, you may also receive an intravenous injection of x-ray contrast/dye. Plan on being at [facility] for 30 minutes or long, depending on the type of exam you are having performed.  This test typically takes 30-45 minutes to complete.  If you have any questions regarding your exam or if you need to reschedule, you may call the CT department at [phone] between the hours of 8:00 am and 5:00 pm, Monday-Friday.  ________________________________________________________________________

## 2015-01-13 ENCOUNTER — Ambulatory Visit (HOSPITAL_COMMUNITY): Payer: Medicare Other

## 2015-01-18 ENCOUNTER — Encounter (HOSPITAL_COMMUNITY): Payer: Self-pay

## 2015-01-18 ENCOUNTER — Ambulatory Visit (HOSPITAL_COMMUNITY)
Admission: RE | Admit: 2015-01-18 | Discharge: 2015-01-18 | Disposition: A | Discharge status: Home / Self Care | Payer: Medicare Other | Source: Ambulatory Visit | Attending: Gastroenterology | Admitting: Gastroenterology

## 2015-01-18 DIAGNOSIS — K259 Gastric ulcer, unspecified as acute or chronic, without hemorrhage or perforation: Secondary | ICD-10-CM

## 2015-01-18 DIAGNOSIS — I700 Atherosclerosis of aorta: Secondary | ICD-10-CM | POA: Diagnosis not present

## 2015-01-18 DIAGNOSIS — M4856XA Collapsed vertebra, not elsewhere classified, lumbar region, initial encounter for fracture: Secondary | ICD-10-CM | POA: Diagnosis not present

## 2015-01-18 DIAGNOSIS — D649 Anemia, unspecified: Secondary | ICD-10-CM | POA: Diagnosis present

## 2015-01-18 DIAGNOSIS — Z860100 Personal history of colon polyps, unspecified: Secondary | ICD-10-CM

## 2015-01-18 MED ORDER — IOHEXOL 300 MG/ML  SOLN
75.0000 mL | Freq: Once | INTRAMUSCULAR | Status: AC | PRN
  Administered 2015-01-18: 75 mL via INTRAVENOUS

## 2015-01-19 ENCOUNTER — Telehealth: Payer: Self-pay | Admitting: Gastroenterology

## 2015-01-20 NOTE — Telephone Encounter (Signed)
There are a few options. I had recommended omprazole 40mg  daily as this typically the cheapest PPI available. We could clarify what PPI is covered by her insurance, any one is fine with me. Protonix 40mg  is often covered. Otherwise, she can also purchase omeprazole 20mg  over the counter and take it twice daily. Can you let her know? Thanks

## 2015-01-20 NOTE — Telephone Encounter (Signed)
Omeprazole is not covered by her pharmacy. Is there an alternative she can take?

## 2015-01-20 NOTE — Telephone Encounter (Signed)
Left vm for pt to callback 

## 2015-01-21 NOTE — Telephone Encounter (Signed)
Patient will purchase OTC. I told her to call back in 2 weeks to report progress and if we need to switch to protonix we can.

## 2015-01-21 NOTE — Telephone Encounter (Signed)
Patient returned phone call. Best # [phone]. Ok to leave detailed message.

## 2015-02-04 ENCOUNTER — Encounter (HOSPITAL_COMMUNITY): Payer: Self-pay | Admitting: *Deleted

## 2015-02-17 ENCOUNTER — Encounter (HOSPITAL_COMMUNITY)
Admission: RE | Disposition: A | Discharge status: Home / Self Care | Payer: Self-pay | Source: Ambulatory Visit | Attending: Family Medicine

## 2015-02-17 ENCOUNTER — Encounter (HOSPITAL_COMMUNITY): Payer: Self-pay

## 2015-02-17 ENCOUNTER — Ambulatory Visit (HOSPITAL_COMMUNITY): Payer: Medicare Other | Admitting: Anesthesiology

## 2015-02-17 ENCOUNTER — Inpatient Hospital Stay (HOSPITAL_COMMUNITY)
Admission: RE | Admit: 2015-02-17 | Discharge: 2015-02-21 | DRG: 286 | Disposition: A | Discharge status: Home / Self Care | Payer: Medicare Other | Source: Ambulatory Visit | Attending: Family Medicine | Admitting: Family Medicine

## 2015-02-17 ENCOUNTER — Ambulatory Visit (HOSPITAL_COMMUNITY): Payer: Medicare Other

## 2015-02-17 DIAGNOSIS — D72829 Elevated white blood cell count, unspecified: Secondary | ICD-10-CM | POA: Diagnosis present

## 2015-02-17 DIAGNOSIS — E11649 Type 2 diabetes mellitus with hypoglycemia without coma: Secondary | ICD-10-CM | POA: Diagnosis present

## 2015-02-17 DIAGNOSIS — I252 Old myocardial infarction: Secondary | ICD-10-CM

## 2015-02-17 DIAGNOSIS — Z79899 Other long term (current) drug therapy: Secondary | ICD-10-CM

## 2015-02-17 DIAGNOSIS — Z7982 Long term (current) use of aspirin: Secondary | ICD-10-CM

## 2015-02-17 DIAGNOSIS — I4729 Other ventricular tachycardia: Secondary | ICD-10-CM

## 2015-02-17 DIAGNOSIS — Z8679 Personal history of other diseases of the circulatory system: Secondary | ICD-10-CM

## 2015-02-17 DIAGNOSIS — E1151 Type 2 diabetes mellitus with diabetic peripheral angiopathy without gangrene: Secondary | ICD-10-CM | POA: Diagnosis present

## 2015-02-17 DIAGNOSIS — J438 Other emphysema: Secondary | ICD-10-CM

## 2015-02-17 DIAGNOSIS — I480 Paroxysmal atrial fibrillation: Secondary | ICD-10-CM | POA: Diagnosis not present

## 2015-02-17 DIAGNOSIS — Z86718 Personal history of other venous thrombosis and embolism: Secondary | ICD-10-CM

## 2015-02-17 DIAGNOSIS — E1165 Type 2 diabetes mellitus with hyperglycemia: Secondary | ICD-10-CM | POA: Diagnosis present

## 2015-02-17 DIAGNOSIS — D120 Benign neoplasm of cecum: Secondary | ICD-10-CM | POA: Diagnosis present

## 2015-02-17 DIAGNOSIS — N189 Chronic kidney disease, unspecified: Secondary | ICD-10-CM | POA: Diagnosis present

## 2015-02-17 DIAGNOSIS — E1122 Type 2 diabetes mellitus with diabetic chronic kidney disease: Secondary | ICD-10-CM | POA: Diagnosis present

## 2015-02-17 DIAGNOSIS — I472 Ventricular tachycardia, unspecified: Secondary | ICD-10-CM

## 2015-02-17 DIAGNOSIS — Z8719 Personal history of other diseases of the digestive system: Secondary | ICD-10-CM | POA: Diagnosis present

## 2015-02-17 DIAGNOSIS — I129 Hypertensive chronic kidney disease with stage 1 through stage 4 chronic kidney disease, or unspecified chronic kidney disease: Secondary | ICD-10-CM | POA: Diagnosis present

## 2015-02-17 DIAGNOSIS — E785 Hyperlipidemia, unspecified: Secondary | ICD-10-CM | POA: Diagnosis present

## 2015-02-17 DIAGNOSIS — R0902 Hypoxemia: Secondary | ICD-10-CM | POA: Diagnosis not present

## 2015-02-17 DIAGNOSIS — E43 Unspecified severe protein-calorie malnutrition: Secondary | ICD-10-CM | POA: Diagnosis present

## 2015-02-17 DIAGNOSIS — J449 Chronic obstructive pulmonary disease, unspecified: Secondary | ICD-10-CM | POA: Diagnosis present

## 2015-02-17 DIAGNOSIS — M1990 Unspecified osteoarthritis, unspecified site: Secondary | ICD-10-CM | POA: Diagnosis present

## 2015-02-17 DIAGNOSIS — I4891 Unspecified atrial fibrillation: Secondary | ICD-10-CM | POA: Diagnosis present

## 2015-02-17 DIAGNOSIS — Z823 Family history of stroke: Secondary | ICD-10-CM

## 2015-02-17 DIAGNOSIS — Z8673 Personal history of transient ischemic attack (TIA), and cerebral infarction without residual deficits: Secondary | ICD-10-CM

## 2015-02-17 DIAGNOSIS — Z8249 Family history of ischemic heart disease and other diseases of the circulatory system: Secondary | ICD-10-CM

## 2015-02-17 DIAGNOSIS — Z681 Body mass index (BMI) 19 or less, adult: Secondary | ICD-10-CM

## 2015-02-17 DIAGNOSIS — Z86010 Personal history of colonic polyps: Secondary | ICD-10-CM

## 2015-02-17 DIAGNOSIS — Z95828 Presence of other vascular implants and grafts: Secondary | ICD-10-CM

## 2015-02-17 DIAGNOSIS — I739 Peripheral vascular disease, unspecified: Secondary | ICD-10-CM | POA: Diagnosis present

## 2015-02-17 DIAGNOSIS — Z7984 Long term (current) use of oral hypoglycemic drugs: Secondary | ICD-10-CM

## 2015-02-17 DIAGNOSIS — Z8711 Personal history of peptic ulcer disease: Secondary | ICD-10-CM

## 2015-02-17 DIAGNOSIS — I2510 Atherosclerotic heart disease of native coronary artery without angina pectoris: Secondary | ICD-10-CM | POA: Diagnosis present

## 2015-02-17 DIAGNOSIS — F17210 Nicotine dependence, cigarettes, uncomplicated: Secondary | ICD-10-CM | POA: Diagnosis present

## 2015-02-17 DIAGNOSIS — Z800 Family history of malignant neoplasm of digestive organs: Secondary | ICD-10-CM

## 2015-02-17 DIAGNOSIS — K863 Pseudocyst of pancreas: Secondary | ICD-10-CM | POA: Diagnosis present

## 2015-02-17 DIAGNOSIS — D519 Vitamin B12 deficiency anemia, unspecified: Secondary | ICD-10-CM | POA: Diagnosis present

## 2015-02-17 HISTORY — DX: Paroxysmal atrial fibrillation: I48.0

## 2015-02-17 HISTORY — PX: ESOPHAGOGASTRODUODENOSCOPY (EGD) WITH PROPOFOL: SHX5813

## 2015-02-17 HISTORY — DX: Atherosclerotic heart disease of native coronary artery without angina pectoris: I25.10

## 2015-02-17 HISTORY — DX: Ventricular tachycardia: I47.2

## 2015-02-17 HISTORY — DX: Other ventricular tachycardia: I47.29

## 2015-02-17 LAB — CK TOTAL AND CKMB (NOT AT ARMC)
CK TOTAL: 30 U/L — AB (ref 38–234)
CK, MB: 3.2 ng/mL (ref 0.5–5.0)
RELATIVE INDEX: INVALID (ref 0.0–2.5)

## 2015-02-17 LAB — GLUCOSE, CAPILLARY: Glucose-Capillary: 216 mg/dL — ABNORMAL HIGH (ref 65–99)

## 2015-02-17 LAB — MRSA PCR SCREENING: MRSA by PCR: NEGATIVE

## 2015-02-17 LAB — COMPREHENSIVE METABOLIC PANEL WITH GFR
ALT: 12 U/L — ABNORMAL LOW (ref 14–54)
AST: 17 U/L (ref 15–41)
Albumin: 3.3 g/dL — ABNORMAL LOW (ref 3.5–5.0)
Alkaline Phosphatase: 46 U/L (ref 38–126)
Anion gap: 12 (ref 5–15)
BUN: 17 mg/dL (ref 6–20)
CO2: 31 mmol/L (ref 22–32)
Calcium: 8.2 mg/dL — ABNORMAL LOW (ref 8.9–10.3)
Chloride: 101 mmol/L (ref 101–111)
Creatinine, Ser: 0.56 mg/dL (ref 0.44–1.00)
GFR calc Af Amer: >60 mL/min
GFR calc non Af Amer: >60 mL/min
Glucose, Bld: 223 mg/dL — ABNORMAL HIGH (ref 65–99)
Potassium: 3.8 mmol/L (ref 3.5–5.1)
Sodium: 144 mmol/L (ref 135–145)
Total Bilirubin: 0.3 mg/dL (ref 0.3–1.2)
Total Protein: 6.6 g/dL (ref 6.5–8.1)

## 2015-02-17 LAB — CBC
HCT: 29.5 % — ABNORMAL LOW (ref 36.0–46.0)
Hemoglobin: 9.6 g/dL — ABNORMAL LOW (ref 12.0–15.0)
MCH: 31.5 pg (ref 26.0–34.0)
MCHC: 32.5 g/dL (ref 30.0–36.0)
MCV: 96.7 fL (ref 78.0–100.0)
PLATELETS: 312 10*3/uL (ref 150–400)
RBC: 3.05 MIL/uL — ABNORMAL LOW (ref 3.87–5.11)
RDW: 17.3 % — AB (ref 11.5–15.5)
WBC: 15.1 10*3/uL — ABNORMAL HIGH (ref 4.0–10.5)

## 2015-02-17 LAB — TROPONIN I
Troponin I: 0.07 ng/mL — ABNORMAL HIGH (ref <0.031)
Troponin I: 0.06 ng/mL — ABNORMAL HIGH (ref <0.031)
Troponin I: 0.05 ng/mL — ABNORMAL HIGH (ref <0.031)

## 2015-02-17 LAB — MAGNESIUM: MAGNESIUM: 1.2 mg/dL — AB (ref 1.7–2.4)

## 2015-02-17 LAB — APTT: aPTT: 27 seconds (ref 24–37)

## 2015-02-17 LAB — PROTIME-INR
INR: 1.12 (ref 0.00–1.49)
PROTHROMBIN TIME: 14.6 s (ref 11.6–15.2)

## 2015-02-17 SURGERY — ESOPHAGOGASTRODUODENOSCOPY (EGD) WITH PROPOFOL
Anesthesia: Monitor Anesthesia Care

## 2015-02-17 SURGERY — COLONOSCOPY
Anesthesia: Moderate Sedation

## 2015-02-17 MED ORDER — METOPROLOL TARTRATE 25 MG PO TABS
50.0000 mg | ORAL_TABLET | Freq: Two times a day (BID) | ORAL | Status: DC
  Administered 2015-02-17 – 2015-02-18 (×3): 50 mg via ORAL
  Filled 2015-02-17: qty 1
  Filled 2015-02-17 (×2): qty 2

## 2015-02-17 MED ORDER — LIDOCAINE HCL (PF) 2 % IJ SOLN
INTRAMUSCULAR | Status: DC | PRN
  Administered 2015-02-17: 20 mg via INTRADERMAL

## 2015-02-17 MED ORDER — LACTATED RINGERS IV SOLN: INTRAVENOUS | Status: DC

## 2015-02-17 MED ORDER — AMIODARONE LOAD VIA INFUSION: 150.0000 mg | Freq: Once | INTRAVENOUS | Status: DC

## 2015-02-17 MED ORDER — ENSURE ENLIVE PO LIQD
237.0000 mL | Freq: Two times a day (BID) | ORAL | Status: DC
  Administered 2015-02-18 – 2015-02-21 (×4): 237 mL via ORAL

## 2015-02-17 MED ORDER — SODIUM CHLORIDE 0.9 % IV SOLN: INTRAVENOUS | Status: DC

## 2015-02-17 MED ORDER — ONDANSETRON HCL 4 MG PO TABS: 4.0000 mg | ORAL_TABLET | Freq: Four times a day (QID) | ORAL | Status: DC | PRN

## 2015-02-17 MED ORDER — HEPARIN (PORCINE) IN NACL 100-0.45 UNIT/ML-% IJ SOLN
600.0000 [IU]/h | INTRAMUSCULAR | Status: DC
  Administered 2015-02-17: 600 [IU]/h via INTRAVENOUS
  Filled 2015-02-17: qty 250

## 2015-02-17 MED ORDER — ACETAMINOPHEN 325 MG PO TABS: 650.0000 mg | ORAL_TABLET | Freq: Four times a day (QID) | ORAL | Status: DC | PRN

## 2015-02-17 MED ORDER — AMIODARONE HCL IN DEXTROSE 360-4.14 MG/200ML-% IV SOLN
30.0000 mg/h | INTRAVENOUS | Status: DC
  Administered 2015-02-18 (×2): 30 mg/h via INTRAVENOUS
  Filled 2015-02-17: qty 200

## 2015-02-17 MED ORDER — ENOXAPARIN SODIUM 30 MG/0.3ML ~~LOC~~ SOLN
30.0000 mg | SUBCUTANEOUS | Status: DC
  Filled 2015-02-17 (×2): qty 0.3

## 2015-02-17 MED ORDER — MAGNESIUM SULFATE 2 GM/50ML IV SOLN
2.0000 g | Freq: Once | INTRAVENOUS | Status: AC
  Administered 2015-02-17: 2 g via INTRAVENOUS
  Filled 2015-02-17 (×2): qty 50

## 2015-02-17 MED ORDER — PANTOPRAZOLE SODIUM 40 MG PO TBEC
40.0000 mg | DELAYED_RELEASE_TABLET | Freq: Every day | ORAL | Status: DC
  Administered 2015-02-17 – 2015-02-21 (×5): 40 mg via ORAL
  Filled 2015-02-17 (×6): qty 1

## 2015-02-17 MED ORDER — ONDANSETRON HCL 4 MG/2ML IJ SOLN: 4.0000 mg | Freq: Four times a day (QID) | INTRAMUSCULAR | Status: DC | PRN

## 2015-02-17 MED ORDER — AMIODARONE HCL IN DEXTROSE 360-4.14 MG/200ML-% IV SOLN
60.0000 mg/h | INTRAVENOUS | Status: DC
  Administered 2015-02-17 (×2): 60 mg/h via INTRAVENOUS
  Filled 2015-02-17: qty 400

## 2015-02-17 MED ORDER — LEVALBUTEROL HCL 0.63 MG/3ML IN NEBU: 0.6300 mg | INHALATION_SOLUTION | Freq: Four times a day (QID) | RESPIRATORY_TRACT | Status: DC | PRN

## 2015-02-17 MED ORDER — LACTATED RINGERS IV SOLN
INTRAVENOUS | Status: DC | PRN
  Administered 2015-02-17: 12:00:00 via INTRAVENOUS

## 2015-02-17 MED ORDER — METOPROLOL TARTRATE 1 MG/ML IV SOLN
INTRAVENOUS | Status: AC
  Administered 2015-02-17: 10 mg via INTRAVENOUS
  Filled 2015-02-17: qty 10

## 2015-02-17 MED ORDER — FENTANYL CITRATE (PF) 100 MCG/2ML IJ SOLN: 25.0000 ug | INTRAMUSCULAR | Status: DC | PRN

## 2015-02-17 MED ORDER — CETYLPYRIDINIUM CHLORIDE 0.05 % MT LIQD
7.0000 mL | Freq: Two times a day (BID) | OROMUCOSAL | Status: DC
  Administered 2015-02-17 – 2015-02-19 (×4): 7 mL via OROMUCOSAL

## 2015-02-17 MED ORDER — AMIODARONE LOAD VIA INFUSION
150.0000 mg | INTRAVENOUS | Status: AC
  Administered 2015-02-17: 150 mg via INTRAVENOUS
  Filled 2015-02-17: qty 83.34

## 2015-02-17 MED ORDER — SIMVASTATIN 40 MG PO TABS
40.0000 mg | ORAL_TABLET | Freq: Every day | ORAL | Status: DC
  Filled 2015-02-17: qty 1

## 2015-02-17 MED ORDER — ACETAMINOPHEN 650 MG RE SUPP: 650.0000 mg | Freq: Four times a day (QID) | RECTAL | Status: DC | PRN

## 2015-02-17 MED ORDER — AMIODARONE HCL IN DEXTROSE 360-4.14 MG/200ML-% IV SOLN: 30.0000 mg/h | INTRAVENOUS | Status: DC

## 2015-02-17 MED ORDER — POTASSIUM CHLORIDE 10 MEQ/100ML IV SOLN
10.0000 meq | INTRAVENOUS | Status: AC
  Administered 2015-02-17 (×4): 10 meq via INTRAVENOUS
  Filled 2015-02-17 (×4): qty 100

## 2015-02-17 MED ORDER — METOPROLOL TARTRATE 1 MG/ML IV SOLN
10.0000 mg | Freq: Once | INTRAVENOUS | Status: AC
  Administered 2015-02-17: 10 mg via INTRAVENOUS

## 2015-02-17 MED ORDER — PROPOFOL 10 MG/ML IV BOLUS
INTRAVENOUS | Status: AC
  Filled 2015-02-17: qty 20

## 2015-02-17 MED ORDER — NIFEDIPINE ER 60 MG PO TB24
60.0000 mg | ORAL_TABLET | Freq: Every day | ORAL | Status: DC
  Administered 2015-02-17 – 2015-02-21 (×5): 60 mg via ORAL
  Filled 2015-02-17 (×5): qty 1

## 2015-02-17 MED ORDER — ATORVASTATIN CALCIUM 10 MG PO TABS
20.0000 mg | ORAL_TABLET | Freq: Every day | ORAL | Status: DC
  Administered 2015-02-17 – 2015-02-20 (×3): 20 mg via ORAL
  Filled 2015-02-17 (×4): qty 2

## 2015-02-17 MED ORDER — AMIODARONE HCL IN DEXTROSE 360-4.14 MG/200ML-% IV SOLN: 60.0000 mg/h | INTRAVENOUS | Status: DC

## 2015-02-17 MED ORDER — PROPOFOL 10 MG/ML IV BOLUS
INTRAVENOUS | Status: DC | PRN
  Administered 2015-02-17: 30 mg via INTRAVENOUS
  Administered 2015-02-17: 50 mg via INTRAVENOUS

## 2015-02-17 SURGICAL SUPPLY — 21 items

## 2015-02-17 NOTE — Progress Notes (Signed)
Pt received to recovery room after EGD and desated with EKG changes. MD and anesthsiologist at the bedside. Pt placed on simple mask at 10 liters sats 90percent. EKG showed Afib. Daughter at bedside and updated of patients condition. Pt decreased to 3LNC, afib on the monitor with other VSS. Oriented x4.  Bed assignment received and report called to [facility] for bed 1431. SMondayrn

## 2015-02-17 NOTE — Progress Notes (Signed)
Patient transferred to bed 1233. Report called to [employer]. At approx 4:55 PM, this RN was notified by telemetry that patient's rhythm was sustained V-Tach in the 200s. Patient was immediately assessed and MD was notified. Patient was asymptomatic and had just walked to the BR with NT assistance. Patient was sitting up on the side of the bed with daughter at bedside. Patient denied SOB or pain. BP 109/80, HR 205, R 18, O2 Sats 98% 3L/[place]. Patient was in no distress and was talking with staff and daughter. Charge RN was notified and rapid response was called. MD ordered 10mg  IV Metoprolol. Medication was given- HR decreased to low 100s and BP also dropped to 50s over 30s. Code Blue was called as patient began feeling weak and becoming very diaphoretic, MD at bedside, RRT at bedside.

## 2015-02-17 NOTE — Progress Notes (Addendum)
I was called at 1325 for wide complex tachycardia at 170 beats/minute. Upon my arrival, Chelsey Bishop was responsive and rhythm was narrow complex at about 130. She denied complaint and denied SOB. Over the next few minutes her HR settled at 90-100 in a sinus rhythm with PACs. Her blood pressure was approximately 125/75 at this time. Additionally, her oxygen saturation was low in the mid to high 70s. Again, she denied dyspnea. Xopenex neb considered but not given as her saturations gradually improved.  Decision made with Dr. Havery Moros to cancel the rest of today's procedure (colonoscopy) and admit for observation. Dr. Havery Moros consulted hospitalist service.    A stat pCXR and ECG were ordered as well as cardiac enzymes, BMET, CBC.  CXR shows no acute abnormality, but probable COPD changes. ECG shows apparent atrial fibrillation at 100 with PVC or aberrantly conducted complex, possible lateral ischemia

## 2015-02-17 NOTE — Progress Notes (Signed)
ANTICOAGULATION CONSULT NOTE - Initial Consult  Pharmacy Consult for Heparin Indication: atrial fibrillation  No Known Allergies  Patient Measurements: Height: 5\' 7"  (170.2 cm) Weight: 94 lb 5.7 oz (42.8 kg) IBW/kg (Calculated) : 61.6 Heparin Dosing Weight: using total body weight of 42.8 kg  Vital Signs: Temp: 98.6 F (37 C) (12/28 1622) Temp Source: Oral (12/28 1622) BP: 152/93 mmHg (12/28 1745) Pulse Rate: 88 (12/28 1745)  Labs:  Recent Labs  02/17/15 1407 02/17/15 1725  HGB 9.6*  --   HCT 29.5*  --   PLT 312  --   CREATININE 0.56  --   CKTOTAL 30*  --   CKMB 3.2  --   TROPONINI 0.07* 0.06*    Estimated Creatinine Clearance: 36 mL/min (by C-G formula based on Cr of 0.56).   Medical History: Past Medical History  Diagnosis Date  . Anemia   . Hypertension   . Varicose veins   . Arthritis   . PVD (peripheral vascular disease) with claudication ([place])   . Cerebral aneurysm 1993    intracranial hemorrhage  . DJD (degenerative joint disease)   . COPD (chronic obstructive pulmonary disease) ([place])   . Pancreatic cyst   . Thyroid nodule   . Emphysema lung ([place])   . Cataract 2005 and 2006    bilateral  . Hyperlipidemia   . Chronic kidney disease   . Myocardial infarction ([place]) 2009  . Stroke [facility])     2009  . Diabetes mellitus without complication [facility])      Assessment: 89 yoF with h/o MI in 2009, stroke in 2009, HTN, CKD, DM2, COPD, prior cerebral aneurysm with intracranial hemorrhage in 1993, PVD was getting a follow-up EGD and colonoscopy for polypectomy today, during the EGD she became acutely hypoxic followed by atrial fibrillation with RVR. Transferred to telemetry and this evening had sustained ventricular tachycardia treated with IV metoprolol. Code Blue called when patient BP trended down but converted to NSR and BP improved.  Transferred to ICU and cardiology consulted.  Will start heparin infusion for atrial fibrillation.  Of note, biopsies were  taken from gastric mass during the EGD this afternoon so will not provide bolus at initiation of heparin infusion.  Patient was not on anticoagulation prior to admission.  Was taking aspirin 81 mg daily.  - Baseline CBC shows Hgb 9.6, platelets 312K - Ordered baseline aPTT and PT/INR STAT - CrCl~35 ml/min  Goal of Therapy:  Heparin level 0.3-0.7 units/ml Monitor platelets by anticoagulation protocol: Yes   Plan:  1.  Start heparin infusion at 600 units/hr. 2.  Check heparin level 8 hours after start of infusion. 3.  Daily heparin level and CBC while on heparin infusion.  Hershal Coria 02/17/2015,6:16 PM

## 2015-02-17 NOTE — Anesthesia Postprocedure Evaluation (Addendum)
Anesthesia Post Note  Patient: Monia Pouch  Procedure(s) Performed: Procedure(s) (LRB): ESOPHAGOGASTRODUODENOSCOPY (EGD) WITH PROPOFOL (N/A)  Patient location during evaluation: PACU Anesthesia Type: MAC Level of consciousness: awake and alert Pain management: pain level controlled Vital Signs Assessment: post-procedure vital signs reviewed and stable Respiratory status: spontaneous breathing, nonlabored ventilation, respiratory function stable and patient connected to nasal cannula oxygen Cardiovascular status: blood pressure returned to baseline and stable Postop Assessment: no signs of nausea or vomiting Anesthetic complications: no Comments: Patient admitted to medicine for  Hypoxia and episode of SVT during upper endoscopy.    Last Vitals:  Filed Vitals:   02/17/15 1430 02/17/15 1440  BP: 165/79 140/78  Pulse: 75 88  Resp: 28 26    Last Pain: There were no vitals filed for this visit.               Sakura Denis L

## 2015-02-17 NOTE — Op Note (Signed)
[place], [id number]   ENDOSCOPY PROCEDURE REPORT  PATIENT: Chelsey Bishop, Chelsey Bishop  MR#: [id number] BIRTHDATE: 1929/11/23 , 85  yrs. old GENDER: female ENDOSCOPIST: Yetta Flock, MD REFERRED BY: PROCEDURE DATE:  02/17/2015 PROCEDURE:  EGD w/ biopsy ASA CLASS:     Class III INDICATIONS:  follow up history of gastric ulcer with low grade dysplasia. MEDICATIONS: Per Anesthesia TOPICAL ANESTHETIC:  DESCRIPTION OF PROCEDURE: After the risks benefits and alternatives of the procedure were thoroughly explained, informed consent was obtained.  The Pentax Gastroscope [id number] endoscope was introduced through the mouth and advanced to the second portion of the duodenum , Without limitations.  The instrument was slowly withdrawn as the mucosa was fully examined.   FINDINGS The esophagus was normal.  DH, GEJ, and SCJ were located 40cm from the incisors.  The stomach was remarkable for an area of what appears to be a healed ulcer with a peripheral rim of nodularity, roughly 1-1.5 cm or so in diameter.  This was located in the proximal antrum along the greater curvature.  Multiple biopsies were obtained.  The duodenal bulb and 2nd portion of the duodenum were normal.  The remaidner of the examined stomach was normal. Of note, during the procedure the patient had a transient oxygen saturation managed by anesthesia.  After the procedure was completed and the endoscope was removed, the patient went from a sinus rhythm into atrial fibrillation, and then into what appeared to be ventricular tachycardia for roughly 2 to 3 minutes.  She maintained a pulse and mentation throughout.  Her oxygen saturation stablized with supplemental oxygen.  She spontaneously converted back to sinus rhythm.  Retroflexed views revealed no abnormalities. The scope was then withdrawn from the patient and the procedure completed.  COMPLICATIONS: As outlined  above.     ENDOSCOPIC IMPRESSION: Proximal antral findings as above, appears to be a healed ulcer with peripheral rim of nodular tissue, multiple biopsies obtained from the lesion, otherwise normal exam Patient went into atrial fibrillation and then ventricular tachycardia, which was aborted spontaneously. The colonoscopy was cancelled and internal medicine was consulted for admission in light of this finding  RECOMMENDATIONS: Patient to be admitted to medical ward for evaluation as above Await pathology results Will discuss plans for future colonoscopy with family to determine if they wish to proceed with further endoscopy given this occurance. Will await cardiac evaluation    eSigned:  Yetta Flock, MD 02/17/2015 2:09 PM    CC: the patient  PATIENT NAME:  Chelsey Bishop MR#: [id number]

## 2015-02-17 NOTE — Anesthesia Preprocedure Evaluation (Addendum)
Anesthesia Evaluation  Patient identified by MRN, date of birth, ID band Patient awake    Reviewed: Allergy & Precautions, H&P , NPO status , Patient's Chart, lab work & pertinent test results  Airway Mallampati: II  TM Distance: >3 FB Neck ROM: full    Dental  (+) Dental Advisory Given, Edentulous Upper, Missing, Partial Lower   Pulmonary neg pulmonary ROS, COPD, Current Smoker,    Pulmonary exam normal breath sounds clear to auscultation       Cardiovascular Exercise Tolerance: Good hypertension, Pt. on home beta blockers and Pt. on medications + CAD and + Past MI  negative cardio ROS Normal cardiovascular exam Rhythm:regular Rate:Normal     Neuro/Psych Cerebral hemorrhage CVA negative neurological ROS  negative psych ROS   GI/Hepatic negative GI ROS, Neg liver ROS,   Endo/Other  negative endocrine ROSdiabetes, Well Controlled, Type 2, Oral Hypoglycemic Agents  Renal/GU negative Renal ROS  negative genitourinary   Musculoskeletal   Abdominal   Peds  Hematology negative hematology ROS (+)   Anesthesia Other Findings   Reproductive/Obstetrics negative OB ROS                           Anesthesia Physical Anesthesia Plan  ASA: III  Anesthesia Plan: MAC   Post-op Pain Management:    Induction:   Airway Management Planned:   Additional Equipment:   Intra-op Plan:   Post-operative Plan:   Informed Consent: I have reviewed the patients History and Physical, chart, labs and discussed the procedure including the risks, benefits and alternatives for the proposed anesthesia with the patient or authorized representative who has indicated his/her understanding and acceptance.   Dental Advisory Given  Plan Discussed with: Surgeon  Anesthesia Plan Comments:         Anesthesia Quick Evaluation

## 2015-02-17 NOTE — Interval H&P Note (Signed)
History and Physical Interval Note:  02/17/2015 12:39 PM  Chelsey Bishop  has presented today for surgery, with the diagnosis of gastrric ulcer/colon polyp /anemia  The various methods of treatment have been discussed with the patient and family. After consideration of risks, benefits and other options for treatment, the patient has consented to  Procedure(s): COLONOSCOPY WITH PROPOFOL (N/A) and EGD as a surgical intervention .  The patient's history has been reviewed, patient examined, no change in status, stable for surgery.  I have reviewed the patient's chart and labs.  Questions were answered to the patient's satisfaction.     Renelda Loma Armbruster

## 2015-02-17 NOTE — Progress Notes (Addendum)
ADDENDUM: Called by RN at 5pm, sustained Vtach, patient alert, awake, talking , HR in 180s-200 range, BP 110s Subsequently given IV metoprolol, then slowly BP trended down, remained in Vtach vs SVT with aberrancy BP trended down to 56/40, then code blue called and ordered IV lidocaine and Bolus, before this could be given converted to NSR, BP in 109/80 and hence lidocaine not given. K was 3.8 and mag, 1.2, ordered IV KCL and MAg She got 50mg  of metoprolol this evening Troponin 0.07, ECHo pending Cards consulted, d/w Dr.Varanasi Transferred to ICU Updated Daughter at [place], [place] [phone]

## 2015-02-17 NOTE — Transfer of Care (Signed)
Immediate Anesthesia Transfer of Care Note  Patient: Chelsey Bishop  Procedure(s) Performed: Procedure(s): COLONOSCOPY WITH PROPOFOL (N/A) ESOPHAGOGASTRODUODENOSCOPY (EGD) WITH PROPOFOL (N/A)  Patient Location: PACU  Anesthesia Type:MAC  Level of Consciousness:  sedated, patient cooperative and responds to stimulation  Airway & Oxygen Therapy:Patient Spontanous Breathing and Patient connected to face mask oxgen  Post-op Assessment:  Report given to PACU RN and Post -op Vital signs reviewed and stable  Post vital signs:  Reviewed and stable  Last Vitals:  Filed Vitals:   02/17/15 1221 02/17/15 1402  BP: 168/84 156/72  Pulse: 80   Resp: 15 26    Complications: No apparent anesthesia complications

## 2015-02-17 NOTE — Anesthesia Postprocedure Evaluation (Signed)
Anesthesia Post Note  Patient: Monia Pouch  Procedure(s) Performed: Procedure(s) (LRB): ESOPHAGOGASTRODUODENOSCOPY (EGD) WITH PROPOFOL (N/A)  Patient location during evaluation: PACU Anesthesia Type: MAC Level of consciousness: awake and alert Pain management: pain level controlled Vital Signs Assessment: post-procedure vital signs reviewed and stable Respiratory status: spontaneous breathing, nonlabored ventilation and patient connected to nasal cannula oxygen Cardiovascular status: blood pressure returned to baseline Anesthetic complications: yes Anesthetic complication details: respiratory event and anesthesia complicationsComments: Case terminated prematurely due to low oxygen saturation and wide complex tachycardia. Admitted to telemetry by hospitalist.    Last Vitals:  Filed Vitals:   02/17/15 1700 02/17/15 1745  BP: 109/80 152/93  Pulse: 205 88  Temp:    Resp: 18 36    Last Pain: There were no vitals filed for this visit.               Delfino Friesen J

## 2015-02-17 NOTE — H&P (Signed)
[facility] History and Physical  Chelsey Bishop [id number] DOB: 1930-02-13 DOA: 02/17/2015  Referring physician: Dr.Armbruster PCP: Chelsey Peaches, MD   Chief Complaint: hypoxia/Afib  HPI: Chelsey Bishop is a 81 y.o. female with PMH of COPD, H/o CVA, HTN, PVD, H/o brain aneurysm in 1993. Was getting a follow-up EGD and colonoscopy for polypectomy today, her EGD was done under MAC with propofol, during the EGD she became acutely hypoxic with O2 sats in the 60s, the scope was withdrawn, her sats recovered to high 80s/90s, endoscope was reinserted and biopsies taken of a gastric mass, subsequently patient became significantly hypoxic again. This was followed by atrial fibrillation, then ventricular tachycardia on the monitor, she remained responsive and never lost her pulse, then went back into atrial fibrillation. Colonoscopy was obviously postponed Since then her O2 sats have been in the high 80s around 88% and she was put on 2 L oxygen. She denies any chest pain or shortness of breath at this time. Her heart rate is controlled, rhythm is afib TRH was consulted for further evaluation and observation  Review of Systems: 12 system review negative  Constitutional:  No weight loss, night sweats, Fevers, chills, fatigue.  HEENT:  No headaches, Difficulty swallowing,Tooth/dental problems,Sore throat,  No sneezing, itching, ear ache, nasal congestion, post nasal drip,  Cardio-vascular:  No chest pain, Orthopnea, PND, swelling in lower extremities, anasarca, dizziness, palpitations  GI:  No heartburn, indigestion, abdominal pain, nausea, vomiting, diarrhea, change in bowel habits, loss of appetite  Resp:  No shortness of breath with exertion or at rest. No excess mucus, no productive cough, No non-productive cough, No coughing up of blood.No change in color of mucus.No wheezing.No chest wall deformity  Skin:  no rash or lesions.  GU:  no dysuria, change in color of urine, no urgency  or frequency. No flank pain.  Musculoskeletal:  No joint pain or swelling. No decreased range of motion. No back pain.  Psych:  No change in mood or affect. No depression or anxiety. No memory loss.   Past Medical History  Diagnosis Date  . Anemia   . Hypertension   . Varicose veins   . Arthritis   . PVD (peripheral vascular disease) with claudication ([place])   . Cerebral aneurysm 1993    intracranial hemorrhage  . DJD (degenerative joint disease)   . COPD (chronic obstructive pulmonary disease) ([place])   . Pancreatic cyst   . Thyroid nodule   . Emphysema lung ([place])   . Cataract 2005 and 2006    bilateral  . Hyperlipidemia   . Chronic kidney disease   . Myocardial infarction ([place]) 2009  . Stroke [facility])     2009  . Diabetes mellitus without complication [facility])    Past Surgical History  Procedure Laterality Date  . Pr vein bypass graft,aorto-fem-pop  1999   Social History:  reports that she has been smoking Cigarettes.  She has never used smokeless tobacco. She reports that she drinks alcohol. She reports that she does not use illicit drugs.  No Known Allergies  Family History  Problem Relation Age of Onset  . Stroke Mother   . Heart disease Father   . Heart disease Sister   . Stroke Sister   . Cancer Brother   . Heart disease Brother   . Colon cancer Brother    patient placement U the area of thickening measuring  Prior to Admission medications   Medication Sig Start Date End Date Taking? Authorizing Provider  aspirin 81 MG tablet Take 81 mg by mouth daily.   Yes Historical Provider, MD  glimepiride (AMARYL) 4 MG tablet Take 2 mg by mouth daily with breakfast.   Yes Historical Provider, MD  lisinopril (PRINIVIL,ZESTRIL) 40 MG tablet Take 40 mg by mouth daily.   Yes Historical Provider, MD  metFORMIN (GLUCOPHAGE) 1000 MG tablet Take 1,000-1,500 mg by mouth 2 (two) times daily with a meal. Takes 1 1/2 in am and 1 at night   Yes Historical Provider, MD  metoprolol  (LOPRESSOR) 50 MG tablet Take 50 mg by mouth 2 (two) times daily. 12/29/14  Yes Historical Provider, MD  Na Sulfate-K Sulfate-Mg Sulf SOLN Take as directed per Colonoscopy. 01/07/15  Yes Manus Gunning, MD  NIFEdipine (PROCARDIA XL/ADALAT-CC) 60 MG 24 hr tablet Take 60 mg by mouth daily.   Yes Historical Provider, MD  omeprazole (PRILOSEC) 40 MG capsule Take 1 capsule (40 mg total) by mouth daily. Patient taking differently: Take 40 mg by mouth 2 (two) times daily.  01/07/15  Yes Manus Gunning, MD  simvastatin (ZOCOR) 40 MG tablet Take 40 mg by mouth daily. 11/24/14  Yes Historical Provider, MD   Physical Exam: Filed Vitals:   02/17/15 1410 02/17/15 1415 02/17/15 1420 02/17/15 1425  BP: 159/62 164/79 152/81 183/71  Pulse: 93 88 90 89  TempSrc:      Resp: 26 27 25 28   Height:      Weight:      SpO2: 92% 96% 99% 99%    Wt Readings from Last 3 Encounters:  02/17/15 42.185 kg (93 lb)  01/07/15 43.182 kg (95 lb 3.2 oz)  11/09/14 44.906 kg (99 lb)    General:  Appears calm and comfortable no distress, AAOx3 Eyes: PERRL, normal lids, irises & conjunctiva ENT: grossly normal hearing, lips & tongue Neck: no LAD, masses or thyromegaly Cardiovascular: Irregular rate and rhythm, no m/r/g. No LE edema. Telemetry: afib Respiratory: CTA bilaterally, no w/r/r. Normal respiratory effort. Abdomen: soft, ntnd Skin: no rash or induration seen on limited exam Musculoskeletal: grossly normal tone BUE/BLE Psychiatric: grossly normal mood and affect, speech fluent and appropriate Neurologic: grossly non-focal.          Labs on Admission:  Basic Metabolic Panel: No results for input(s): NA, K, CL, CO2, GLUCOSE, BUN, CREATININE, CALCIUM, MG, PHOS in the last 168 hours. Liver Function Tests: No results for input(s): AST, ALT, ALKPHOS, BILITOT, PROT, ALBUMIN in the last 168 hours. No results for input(s): LIPASE, AMYLASE in the last 168 hours. No results for input(s): AMMONIA in the  last 168 hours. CBC:  Recent Labs Lab 02/17/15 1407  WBC 15.1*  HGB 9.6*  HCT 29.5*  MCV 96.7  PLT 312   Cardiac Enzymes: No results for input(s): CKTOTAL, CKMB, CKMBINDEX, TROPONINI in the last 168 hours.  BNP (last 3 results) No results for input(s): BNP in the last 8760 hours.  ProBNP (last 3 results) No results for input(s): PROBNP in the last 8760 hours.  CBG:  Recent Labs Lab 02/17/15 1231  GLUCAP 216*    Radiological Exams on Admission: Dg Chest Port 1 View  02/17/2015  CLINICAL DATA:  Shortness of breath.  Hypoxia.  Recent endoscopy. EXAM: PORTABLE CHEST 1 VIEW COMPARISON:  Chest x-ray dated 08/17/2007 and CT scan of the chest dated 02/18/2010 FINDINGS: Heart size and pulmonary vascularity are normal. Extensive calcification in the thoracic aorta. There are no infiltrates or effusions. No pneumothorax. The lungs are slightly hyperinflated with flattening of  the diaphragm suggesting emphysema. No acute osseous abnormality. IMPRESSION: No acute abnormality.  Probable COPD.  Aortic atherosclerosis. Electronically Signed   By: Lorriane Shire M.D.   On: 02/17/2015 14:23    EKG: Independently reviewed. Afib rate controlled  Assessment/Plan   Ventricular tachycardia -likely due to hypoxia, now resolved -check Bmet, mag, troponin -check ECHO, myoview in 2009 was negative -resume Metoprolol, give dose now -Cards consulted, will see pt in am  P.Afib -newly diagnosed -her CHADSvasc score is high at 8, but has gastric mass with heme positive stools and h/o intracranial aneurysm, i think she would be risky to start on anticoagulation -await cardiac input regarding this -continue metoprolol, HR controlled  Transient hypoxia -due to sedation, underlying COPD -lungs clear, no wheezing, no vomiting to suspect aspiration -check CXR, nebs PRN, wean off O2   History of gastric ulcer/mass -s/p biopsies today, GI will FU  Colon polyp -polypectomy to be done at another  time    H/O: CVA  -see above, hold ASA, resume when ok with GI   H/o PVD -h/o Right fem-pop bypass    DM -hold metformin and glipizide, SSI for now  HTN -continue metoprolol, hold lisinopril  [facility] cards consulted for tomorrow  Code Status: Full Code DVT Prophylaxis:lovenox Family Communication: daughter at bedside Disposition Plan:home in 1-2days hopefully  Time spent:38min  [facility] Pager [phone]

## 2015-02-17 NOTE — Consult Note (Signed)
CONSULTATION NOTE  Reason for Consult: A-fib, VT  Requesting Physician: Dr. Broadus John   Cardiologist: None (NEW)  HPI: This is a 81 y.o. female smoker with a complex past medical history significant for myocardial infarction in 2009, stroke in 2009 and history of hypertension, chronic kidney disease, type 2 diabetes, COPD, prior cerebral aneurysm with intracranial hemorrhage in 1993, peripheral vascular disease with Right fem-pop bypass graft with translocated nonreversed saphenous vein on 06/24/1997 by Dr. Donnetta Hutching, hypertension, anemia and dyslipidemia. Her only follow-up has been with her PCP Dr. Levin Erp. Family history is significant for heart disease and stroke in her parents and brother and sister. She was undergoing endoscopy today for a gastric mass and during esophageal intubation developed significant hypoxia with SPO2 in the 60s. This improved with withdrawal of the scope and she was reintubated and noted to have recurrent hypoxia and developed atrial fibrillation with rapid ventricular response and ultimately a wide complex tachycardia, concerning for VT. Initial troponin was mildly elevated 0.07 and then 0.06 approximately 3 hours later. Laboratory work otherwise was unremarkable except for again elevated white blood cell count of 15,100.  Endoscopy revealed what appears to be a healed ulcer with fibrinous rim of tissue. She apparently has a colon mass which was scheduled to be excised, but not performed due to her decompensation. Currently, she denies chest pain and shortness of breath. She says she is hungry and hasn't eaten in several days.  PMHx:  Past Medical History  Diagnosis Date  . Anemia   . Hypertension   . Varicose veins   . Arthritis   . PVD (peripheral vascular disease) with claudication ([place])   . Cerebral aneurysm 1993    intracranial hemorrhage  . DJD (degenerative joint disease)   . COPD (chronic obstructive pulmonary disease) ([place])   . Pancreatic cyst   .  Thyroid nodule   . Emphysema lung ([place])   . Cataract 2005 and 2006    bilateral  . Hyperlipidemia   . Chronic kidney disease   . Myocardial infarction ([place]) 2009  . Stroke [facility])     2009  . Diabetes mellitus without complication [facility])    Past Surgical History  Procedure Laterality Date  . Pr vein bypass graft,aorto-fem-pop  1999    FAMHx: Family History  Problem Relation Age of Onset  . Stroke Mother   . Heart disease Father   . Heart disease Sister   . Stroke Sister   . Cancer Brother   . Heart disease Brother   . Colon cancer Brother     SOCHx:  reports that she has been smoking Cigarettes.  She has never used smokeless tobacco. She reports that she drinks alcohol. She reports that she does not use illicit drugs.  ALLERGIES: No Known Allergies  ROS: A comprehensive review of systems was negative except for: Constitutional: positive for weight loss Respiratory: positive for chronic bronchitis Cardiovascular: positive for irregular heart beat Gastrointestinal: positive for dyspepsia  HOME MEDICATIONS:   Medication List    ASK your doctor about these medications        aspirin 81 MG tablet  Take 81 mg by mouth daily.     glimepiride 4 MG tablet  Commonly known as:  AMARYL  Take 2 mg by mouth daily with breakfast.     lisinopril 40 MG tablet  Commonly known as:  PRINIVIL,ZESTRIL  Take 40 mg by mouth daily.     metFORMIN 1000 MG tablet  Commonly known as:  GLUCOPHAGE  Take 1,000-1,500 mg by mouth 2 (two) times daily with a meal. Takes 1 1/2 in am and 1 at night     metoprolol 50 MG tablet  Commonly known as:  LOPRESSOR  Take 50 mg by mouth 2 (two) times daily.     Na Sulfate-K Sulfate-Mg Sulf Soln  Take as directed per Colonoscopy.     NIFEdipine 60 MG 24 hr tablet  Commonly known as:  PROCARDIA XL/ADALAT-CC  Take 60 mg by mouth daily.     omeprazole 40 MG capsule  Commonly known as:  PRILOSEC  Take 1 capsule (40 mg total) by mouth daily.      simvastatin 40 MG tablet  Commonly known as:  ZOCOR  Take 40 mg by mouth daily.        HOSPITAL MEDICATIONS: I have reviewed the patient's current medications.  VITALS: Blood pressure 171/78, pulse 84, temperature 98.6 F (37 C), temperature source Oral, resp. rate 26, height 5' 7"  (1.702 m), weight 94 lb 5.7 oz (42.8 kg), SpO2 95 %.  PHYSICAL EXAM: General appearance: alert, appears older than stated age, no distress, pale and thin Neck: no carotid bruit and no JVD Lungs: diminished breath sounds bilaterally Heart: irregularly irregular rhythm and S1, S2 normal Abdomen: soft, non-tender; bowel sounds normal; no masses,  no organomegaly and scaphoid Extremities: extremities normal, atraumatic, no cyanosis or edema Pulses: poor distal pulses, cool extremities Skin: pale, cool, dry Neurologic: Mental status: Alert, oriented, thought content appropriate Psych: Pleasant  LABS: Results for orders placed or performed during the hospital encounter of 02/17/15 (from the past 48 hour(s))  Glucose, capillary     Status: Abnormal   Collection Time: 02/17/15 12:31 PM  Result Value Ref Range   Glucose-Capillary 216 (H) 65 - 99 mg/dL  CBC     Status: Abnormal   Collection Time: 02/17/15  2:07 PM  Result Value Ref Range   WBC 15.1 (H) 4.0 - 10.5 K/uL   RBC 3.05 (L) 3.87 - 5.11 MIL/uL   Hemoglobin 9.6 (L) 12.0 - 15.0 g/dL   HCT 29.5 (L) 36.0 - 46.0 %   MCV 96.7 78.0 - 100.0 fL   MCH 31.5 26.0 - 34.0 pg   MCHC 32.5 30.0 - 36.0 g/dL   RDW 17.3 (H) 11.5 - 15.5 %   Platelets 312 150 - 400 K/uL  Comprehensive metabolic panel     Status: Abnormal   Collection Time: 02/17/15  2:07 PM  Result Value Ref Range   Sodium 144 135 - 145 mmol/L   Potassium 3.8 3.5 - 5.1 mmol/L   Chloride 101 101 - 111 mmol/L   CO2 31 22 - 32 mmol/L   Glucose, Bld 223 (H) 65 - 99 mg/dL   BUN 17 6 - 20 mg/dL   Creatinine, Ser 0.56 0.44 - 1.00 mg/dL   Calcium 8.2 (L) 8.9 - 10.3 mg/dL   Total Protein 6.6 6.5 -  8.1 g/dL   Albumin 3.3 (L) 3.5 - 5.0 g/dL   AST 17 15 - 41 U/L   ALT 12 (L) 14 - 54 U/L   Alkaline Phosphatase 46 38 - 126 U/L   Total Bilirubin 0.3 0.3 - 1.2 mg/dL   GFR calc non Af Amer >60 >60 mL/min   GFR calc Af Amer >60 >60 mL/min    Comment: (NOTE) The eGFR has been calculated using the CKD EPI equation. This calculation has not been validated in all clinical situations. eGFR's persistently <60 mL/min signify possible  Chronic Kidney Disease.    Anion gap 12 5 - 15  CK total and CKMB (cardiac)not at [facility]     Status: Abnormal   Collection Time: 02/17/15  2:07 PM  Result Value Ref Range   Total CK 30 (L) 38 - 234 U/L   CK, MB 3.2 0.5 - 5.0 ng/mL   Relative Index RELATIVE INDEX IS INVALID 0.0 - 2.5    Comment: WHEN CK < 100 U/L        Performed at [facility]   Magnesium     Status: Abnormal   Collection Time: 02/17/15  2:07 PM  Result Value Ref Range   Magnesium 1.2 (L) 1.7 - 2.4 mg/dL  Troponin I     Status: Abnormal   Collection Time: 02/17/15  2:07 PM  Result Value Ref Range   Troponin I 0.07 (H) <0.031 ng/mL    Comment:        PERSISTENTLY INCREASED TROPONIN VALUES IN THE RANGE OF 0.04-0.49 ng/mL CAN BE SEEN IN:       -UNSTABLE ANGINA       -CONGESTIVE HEART FAILURE       -MYOCARDITIS       -CHEST TRAUMA       -ARRYHTHMIAS       -LATE PRESENTING MYOCARDIAL INFARCTION       -COPD   CLINICAL FOLLOW-UP RECOMMENDED.   Troponin I (q 6hr x 3)     Status: Abnormal   Collection Time: 02/17/15  5:25 PM  Result Value Ref Range   Troponin I 0.06 (H) <0.031 ng/mL    Comment:        PERSISTENTLY INCREASED TROPONIN VALUES IN THE RANGE OF 0.04-0.49 ng/mL CAN BE SEEN IN:       -UNSTABLE ANGINA       -CONGESTIVE HEART FAILURE       -MYOCARDITIS       -CHEST TRAUMA       -ARRYHTHMIAS       -LATE PRESENTING MYOCARDIAL INFARCTION       -COPD   CLINICAL FOLLOW-UP RECOMMENDED.     IMAGING: Dg Chest Port 1 View  02/17/2015  CLINICAL DATA:  Shortness of  breath.  Hypoxia.  Recent endoscopy. EXAM: PORTABLE CHEST 1 VIEW COMPARISON:  Chest x-ray dated 08/17/2007 and CT scan of the chest dated 02/18/2010 FINDINGS: Heart size and pulmonary vascularity are normal. Extensive calcification in the thoracic aorta. There are no infiltrates or effusions. No pneumothorax. The lungs are slightly hyperinflated with flattening of the diaphragm suggesting emphysema. No acute osseous abnormality. IMPRESSION: No acute abnormality.  Probable COPD.  Aortic atherosclerosis. Electronically Signed   By: Lorriane Shire M.D.   On: 02/17/2015 14:23    HOSPITAL DIAGNOSES: Principal Problem:   Hypoxia Active Problems:   History of gastric ulcer   COPD (chronic obstructive pulmonary disease) (HCC)   Paroxysmal ventricular tachycardia (HCC)   H/O: CVA (cerebrovascular accident)   Atrial fibrillation (HCC)   Ventricular tachycardia, sustained ([place])   PAD (peripheral artery disease) ([place])   Status post femoral-popliteal bypass surgery   IMPRESSION: 1. PAF with RVR 2. Sustained wide-complex tachycardia - suspicious for VT 3. Transient hypoxemia  RECOMMENDATION: 1. Mrs. Guadron had transient hypoxemia today during her procedure which was complicated by A. fib with RVR which was paroxysmal and sustained wide-complex tachycardia suspicious for VT. Amazingly she seemed to tolerate the wide complex tachycardia fairly well, despite a heart rate over 200. I'm concerned about undiagnosed obstructive coronary artery disease,  although 2 troponins shortly after her event are only mildly elevated and do not indicate significant rise. They may be consistent with a cardiomyopathy and I recommended echocardiogram to assess her LV function. Ultimately, a left heart catheterization will likely be necessary. I would recommend starting IV heparin for elevated troponin, possible ischemia and PAF. She may need repeat head imaging given history of intracranial aneurysm with ICH (however, this is  very remote - likely not a factor given current indication for anticoagulation). She will also need better blood pressure control. Restart home BP meds as per the primary service. Agree to replete K+ >4 and Mg >2.0.  Cardiology will follow closely. Thanks for the consultation.  CRITICAL CARE:  The patient is critically ill with multi-organ system failure and requires high complexity decision making for assessment and support, frequent evaluation and titration of therapies, application of advanced monitoring technologies and extensive interpretation of multiple databases.  Time Spent Directly with Patient: 55 minutes  Pixie Casino, MD, [facility] Attending Cardiologist [place] 02/17/2015, 6:37 PM

## 2015-02-17 NOTE — H&P (Signed)
HPI :  81 y/o female here for follow up. She is a former patient Dr. [name]. She has a history of DM, HTN, PVD, and COPD. Also with history of brain aneurysm and MI/CVA in 2009. She was previously referred for heme positive stools and underwent EGD and colonoscopy on September 16th. On EGD she had a "40mm x 33mm ulcer" of the gastric body which was biopsied. No evidence of H pylori however intestinal metaplasia noted with low grade dysplasia. Colonoscopy remarkable for a "2mm cecal polyp" which was not removed but biopsied and c/w adenoma. She denies abdominal pains at all. She does not eat well however, she has a poor appetite, and weight maintains between 90-100 lbs. She denies early satiety. Her weight fluctuates. She denies any bowel habit changes or blood in the stools. She had an EUS for a pancreatic cyst in 2009, she reports being septic following biopsy. She denies any other NSAIDs. No plavix. She is taking pepsid but no PPIs. She is not aware of biopsy results until today. Daughter reports the patient in functional, can ambulate on her own. Not limited by chest pains or shortness of breath.  Colonoscopy 11/09/14 - 2.5cm cecal polyp, c/w adenoma, not removed EGD - 3x3mm ulcer, biopsies with low grade dysplasia in setting of IM, H pylori negative   Past Medical History  Diagnosis Date  . Anemia   . Hypertension   . Varicose veins   . Arthritis   . Diabetes mellitus without complication ([place])   . PVD (peripheral vascular disease) with claudication ([place])   . Cerebral aneurysm 1993    intracranial hemorrhage  . DJD (degenerative joint disease)   . COPD (chronic obstructive pulmonary disease) ([place])   . Pancreatic cyst   . Thyroid nodule   . Emphysema lung ([place])   . Cataract 2005 and 2006    bilateral  . Hyperlipidemia   . Chronic kidney disease   . Myocardial infarction ([place]) 2009  . Stroke [facility])     2009     Past  Surgical History  Procedure Laterality Date  . Pr vein bypass graft,aorto-fem-pop  1999   Family History  Problem Relation Age of Onset  . Stroke Mother   . Heart disease Father   . Heart disease Sister   . Stroke Sister   . Cancer Brother   . Heart disease Brother   . Colon cancer Brother    Social History  Substance Use Topics  . Smoking status: Current Every Day Smoker    Types: Cigarettes    Last Attempt to Quit: 12/07/2006  . Smokeless tobacco: Never Used  . Alcohol Use: 0.0 oz/week    0 Standard drinks or equivalent per week   Current Outpatient Prescriptions  Medication Sig Dispense Refill  . aspirin 81 MG tablet Take 81 mg by mouth daily.    . famotidine (PEPCID) 40 MG tablet Take 1 tablet (40 mg total) by mouth daily. 30 tablet 2  . glimepiride (AMARYL) 4 MG tablet Take 2 mg by mouth daily with breakfast.    . lisinopril (PRINIVIL,ZESTRIL) 40 MG tablet Take 40 mg by mouth daily.    . metformin (FORTAMET) 1000 MG (OSM) 24 hr tablet Take 1,000 mg by mouth daily with breakfast.    . metFORMIN (GLUCOPHAGE) 1000 MG tablet Take 1,000 mg by mouth 2 (two) times daily with a meal. Takes 1 1/2 in am and 1 at night    . Metoprolol Tartrate (LOPRESSOR PO) Take 1 tablet by  mouth 2 (two) times daily.    Marland Kitchen NIFEdipine (PROCARDIA XL/ADALAT-CC) 60 MG 24 hr tablet Take 60 mg by mouth daily.    . Simvastatin (ZOCOR PO) Take 1 tablet by mouth daily.    . Na Sulfate-K Sulfate-Mg Sulf SOLN Take as directed per Colonoscopy. 354 mL 0  . omeprazole (PRILOSEC) 40 MG capsule Take 1 capsule (40 mg total) by mouth daily. 90 capsule 1   No current facility-administered medications for this visit.   No Known Allergies   Review of Systems: All systems reviewed and negative except where noted in HPI.    Recent Labs    Lab Results  Component Value Date   WBC  12.8* 01/07/2015   HGB 12.2 01/07/2015   HCT 38.5 01/07/2015   MCV 98.4 01/07/2015   PLT 498.0* 01/07/2015       Recent Labs    Lab Results  Component Value Date   ALT 7 01/07/2015   AST 13 01/07/2015   ALKPHOS 78 01/07/2015   BILITOT 0.3 01/07/2015      Recent Labs    Lab Results  Component Value Date   CREATININE 0.62 01/07/2015   BUN 14 01/07/2015   NA 140 01/07/2015   K 5.1 01/07/2015   CL 98 01/07/2015   CO2 26 01/07/2015       Physical Exam: BP 138/70 mmHg  Pulse 86  Ht 5\' 3"  (1.6 m)  Wt 95 lb 3.2 oz (43.182 kg)  BMI 16.87 kg/m2  SpO2 85% Constitutional: Pleasant, thin appearing, female in no acute distress. HEENT: Normocephalic and atraumatic. Conjunctivae are normal. No scleral icterus. Neck supple.  Cardiovascular: Normal rate, regular rhythm.  Pulmonary/chest: Effort normal and breath sounds norma although somewhat decreased bilaterally. Abdominal: Soft, nondistended, nontender. Bowel sounds active throughout. There are no masses palpable. No hepatomegaly. Extremities: no edema Lymphadenopathy: No cervical adenopathy noted. Neurological: Alert and oriented to person place and time. Skin: Skin is warm and dry. No rashes noted. Psychiatric: Normal mood and affect. Behavior is normal.   ASSESSMENT AND PLAN: 81 y/o female with multiple comorbidities as outlined above who underwent an endoscopic evaluate for heme positive stools in September noted to have a gastric ulcer with intestinal metaplasia and low grade dysplasia, as well as a large cecal adenoma which was not removed. The patient and daughter report they were not aware of the pathology results until today.   The gastric ulcer with low grade dysplasia is concerning, and discussed risk of possible malignancy from this. The patient denies abdominal pain but does not eat well and remains underweight. She has not had abdominal imaging  in recent years and given this finding and her weight loss recommend a CT abdomen to see if there is any abnormality concerning for malignancy associated with this. Otherwise, recommend she stop Pepsid and will place her on Omeprazole 40mg  daily. I will await CT results but ultimately she will warrant repeat EGD with biopsies, and may consider EUS pending the CT results to see if this would be a lesion amenable to EMR if no evidence of malignancy but with dysplasia. I have otherwise ordered CBC and CMP today, she has no anemia but mild leukocytosis and elevated platelets suggesting active inflammation. Will await CT.   Regarding the cecal polyp, I discussed options to manage this which include endoscopic polypectomy, elective surgical colon resection, or observation given her age and comorbidities. We discussed the risks of each strategy. On review of colonoscopy report I do think it is  amenable to endoscopic removal however discussed that with the location of this lesion in the cecum, there is increased risk for bleeding and perforation. I discussed the risks of surgical options. They wished to have something done about this rather than observe as she is otherwise functional at this time. Following our discussion, while understanding the risks, they wish to try endoscopic polypectomy first. This will be scheduled to be done at the hospital given her comorbidities. Will await results.   Bronson Cellar, MD [facility] Pager [phone]         NO interval changes from HP note from November 2017.

## 2015-02-17 NOTE — Progress Notes (Addendum)
Inpatient Diabetes Program Recommendations  AACE/ADA: New Consensus Statement on Inpatient Glycemic Control (2015)  Target Ranges:  Prepandial:   less than 140 mg/dL      Peak postprandial:   less than 180 mg/dL (1-2 hours)      Critically ill patients:  140 - 180 mg/dL    Results for Chelsey Bishop, Chelsey Bishop (MRN [id number]) as of 02/17/2015 14:51  Ref. Range 02/17/2015 12:31  Glucose-Capillary Latest Ref Range: 65-99 mg/dL 216 (H)     Admit for: EGD with biopsy  History: DM, COPD, HTN  Home DM Meds: Amaryl 2 mg daily       Metformin 1500 mg AM/ 1000 mg PM  Current Insulin Orders: None yet- Still in Endoscopy     MD- Please consider starting Novolog Sensitive SSI (0-9 units) TID AC + HS after EGD today    --Will follow patient during hospitalization--  Wyn Quaker RN, MSN, CDE Diabetes Coordinator Inpatient Glycemic Control Team Team Pager: [phone] (8a-5p)

## 2015-02-18 ENCOUNTER — Observation Stay (HOSPITAL_BASED_OUTPATIENT_CLINIC_OR_DEPARTMENT_OTHER): Payer: Medicare Other

## 2015-02-18 ENCOUNTER — Encounter (HOSPITAL_COMMUNITY): Payer: Self-pay | Admitting: Gastroenterology

## 2015-02-18 ENCOUNTER — Observation Stay (HOSPITAL_COMMUNITY): Payer: Medicare Other

## 2015-02-18 DIAGNOSIS — I472 Ventricular tachycardia, unspecified: Secondary | ICD-10-CM

## 2015-02-18 DIAGNOSIS — Z86010 Personal history of colonic polyps: Secondary | ICD-10-CM | POA: Diagnosis not present

## 2015-02-18 DIAGNOSIS — R7989 Other specified abnormal findings of blood chemistry: Secondary | ICD-10-CM | POA: Diagnosis not present

## 2015-02-18 DIAGNOSIS — D519 Vitamin B12 deficiency anemia, unspecified: Secondary | ICD-10-CM | POA: Diagnosis present

## 2015-02-18 DIAGNOSIS — M1990 Unspecified osteoarthritis, unspecified site: Secondary | ICD-10-CM | POA: Diagnosis present

## 2015-02-18 DIAGNOSIS — K863 Pseudocyst of pancreas: Secondary | ICD-10-CM | POA: Diagnosis present

## 2015-02-18 DIAGNOSIS — Z8249 Family history of ischemic heart disease and other diseases of the circulatory system: Secondary | ICD-10-CM | POA: Diagnosis not present

## 2015-02-18 DIAGNOSIS — Z7984 Long term (current) use of oral hypoglycemic drugs: Secondary | ICD-10-CM | POA: Diagnosis not present

## 2015-02-18 DIAGNOSIS — Z823 Family history of stroke: Secondary | ICD-10-CM | POA: Diagnosis not present

## 2015-02-18 DIAGNOSIS — F17210 Nicotine dependence, cigarettes, uncomplicated: Secondary | ICD-10-CM | POA: Diagnosis present

## 2015-02-18 DIAGNOSIS — J449 Chronic obstructive pulmonary disease, unspecified: Secondary | ICD-10-CM | POA: Diagnosis present

## 2015-02-18 DIAGNOSIS — Z8719 Personal history of other diseases of the digestive system: Secondary | ICD-10-CM | POA: Diagnosis not present

## 2015-02-18 DIAGNOSIS — E11649 Type 2 diabetes mellitus with hypoglycemia without coma: Secondary | ICD-10-CM | POA: Diagnosis present

## 2015-02-18 DIAGNOSIS — Z79899 Other long term (current) drug therapy: Secondary | ICD-10-CM | POA: Diagnosis not present

## 2015-02-18 DIAGNOSIS — E1151 Type 2 diabetes mellitus with diabetic peripheral angiopathy without gangrene: Secondary | ICD-10-CM | POA: Diagnosis present

## 2015-02-18 DIAGNOSIS — Z8711 Personal history of peptic ulcer disease: Secondary | ICD-10-CM | POA: Diagnosis not present

## 2015-02-18 DIAGNOSIS — Z8679 Personal history of other diseases of the circulatory system: Secondary | ICD-10-CM | POA: Diagnosis not present

## 2015-02-18 DIAGNOSIS — I4891 Unspecified atrial fibrillation: Secondary | ICD-10-CM | POA: Diagnosis not present

## 2015-02-18 DIAGNOSIS — D72829 Elevated white blood cell count, unspecified: Secondary | ICD-10-CM | POA: Diagnosis present

## 2015-02-18 DIAGNOSIS — Z681 Body mass index (BMI) 19 or less, adult: Secondary | ICD-10-CM | POA: Diagnosis not present

## 2015-02-18 DIAGNOSIS — I129 Hypertensive chronic kidney disease with stage 1 through stage 4 chronic kidney disease, or unspecified chronic kidney disease: Secondary | ICD-10-CM | POA: Diagnosis present

## 2015-02-18 DIAGNOSIS — I2510 Atherosclerotic heart disease of native coronary artery without angina pectoris: Secondary | ICD-10-CM | POA: Diagnosis present

## 2015-02-18 DIAGNOSIS — I252 Old myocardial infarction: Secondary | ICD-10-CM | POA: Diagnosis not present

## 2015-02-18 DIAGNOSIS — E1165 Type 2 diabetes mellitus with hyperglycemia: Secondary | ICD-10-CM | POA: Diagnosis present

## 2015-02-18 DIAGNOSIS — J438 Other emphysema: Secondary | ICD-10-CM | POA: Diagnosis not present

## 2015-02-18 DIAGNOSIS — Z8673 Personal history of transient ischemic attack (TIA), and cerebral infarction without residual deficits: Secondary | ICD-10-CM | POA: Diagnosis not present

## 2015-02-18 DIAGNOSIS — Z86718 Personal history of other venous thrombosis and embolism: Secondary | ICD-10-CM | POA: Diagnosis not present

## 2015-02-18 DIAGNOSIS — D120 Benign neoplasm of cecum: Secondary | ICD-10-CM | POA: Diagnosis present

## 2015-02-18 DIAGNOSIS — Z800 Family history of malignant neoplasm of digestive organs: Secondary | ICD-10-CM | POA: Diagnosis not present

## 2015-02-18 DIAGNOSIS — E1122 Type 2 diabetes mellitus with diabetic chronic kidney disease: Secondary | ICD-10-CM | POA: Diagnosis present

## 2015-02-18 DIAGNOSIS — E43 Unspecified severe protein-calorie malnutrition: Secondary | ICD-10-CM | POA: Diagnosis present

## 2015-02-18 DIAGNOSIS — E785 Hyperlipidemia, unspecified: Secondary | ICD-10-CM | POA: Diagnosis present

## 2015-02-18 DIAGNOSIS — R0902 Hypoxemia: Secondary | ICD-10-CM | POA: Diagnosis present

## 2015-02-18 DIAGNOSIS — I739 Peripheral vascular disease, unspecified: Secondary | ICD-10-CM | POA: Diagnosis not present

## 2015-02-18 DIAGNOSIS — I480 Paroxysmal atrial fibrillation: Secondary | ICD-10-CM | POA: Diagnosis not present

## 2015-02-18 DIAGNOSIS — N189 Chronic kidney disease, unspecified: Secondary | ICD-10-CM | POA: Diagnosis present

## 2015-02-18 DIAGNOSIS — Z7982 Long term (current) use of aspirin: Secondary | ICD-10-CM | POA: Diagnosis not present

## 2015-02-18 LAB — COMPREHENSIVE METABOLIC PANEL
ALT: 10 U/L — ABNORMAL LOW (ref 14–54)
ANION GAP: 12 (ref 5–15)
AST: 21 U/L (ref 15–41)
Albumin: 3.1 g/dL — ABNORMAL LOW (ref 3.5–5.0)
Alkaline Phosphatase: 45 U/L (ref 38–126)
BUN: 14 mg/dL (ref 6–20)
CHLORIDE: 100 mmol/L — AB (ref 101–111)
CO2: 27 mmol/L (ref 22–32)
Calcium: 8.2 mg/dL — ABNORMAL LOW (ref 8.9–10.3)
Creatinine, Ser: 0.56 mg/dL (ref 0.44–1.00)
Glucose, Bld: 248 mg/dL — ABNORMAL HIGH (ref 65–99)
POTASSIUM: 4.8 mmol/L (ref 3.5–5.1)
SODIUM: 139 mmol/L (ref 135–145)
Total Bilirubin: 0.1 mg/dL — ABNORMAL LOW (ref 0.3–1.2)
Total Protein: 6.5 g/dL (ref 6.5–8.1)

## 2015-02-18 LAB — CBC
HCT: 26.6 % — ABNORMAL LOW (ref 36.0–46.0)
Hemoglobin: 8.8 g/dL — ABNORMAL LOW (ref 12.0–15.0)
MCH: 32 pg (ref 26.0–34.0)
MCHC: 33.1 g/dL (ref 30.0–36.0)
MCV: 96.7 fL (ref 78.0–100.0)
PLATELETS: 311 10*3/uL (ref 150–400)
RBC: 2.75 MIL/uL — AB (ref 3.87–5.11)
RDW: 17.4 % — AB (ref 11.5–15.5)
WBC: 20.2 10*3/uL — ABNORMAL HIGH (ref 4.0–10.5)

## 2015-02-18 LAB — GLUCOSE, CAPILLARY
GLUCOSE-CAPILLARY: 290 mg/dL — AB (ref 65–99)
GLUCOSE-CAPILLARY: 170 mg/dL — AB (ref 65–99)
GLUCOSE-CAPILLARY: 223 mg/dL — AB (ref 65–99)
GLUCOSE-CAPILLARY: 30 mg/dL — AB (ref 65–99)
Glucose-Capillary: 189 mg/dL — ABNORMAL HIGH (ref 65–99)
Glucose-Capillary: 24 mg/dL — CL (ref 65–99)

## 2015-02-18 LAB — TROPONIN I: Troponin I: 0.05 ng/mL — ABNORMAL HIGH (ref <0.031)

## 2015-02-18 LAB — HEPARIN LEVEL (UNFRACTIONATED)
HEPARIN UNFRACTIONATED: 0.16 [IU]/mL — AB (ref 0.30–0.70)
HEPARIN UNFRACTIONATED: 0.31 [IU]/mL (ref 0.30–0.70)

## 2015-02-18 MED ORDER — ASPIRIN EC 81 MG PO TBEC
81.0000 mg | DELAYED_RELEASE_TABLET | Freq: Every day | ORAL | Status: DC
  Administered 2015-02-18: 81 mg via ORAL
  Filled 2015-02-18: qty 1

## 2015-02-18 MED ORDER — INSULIN ASPART 100 UNIT/ML ~~LOC~~ SOLN
0.0000 [IU] | Freq: Three times a day (TID) | SUBCUTANEOUS | Status: DC
  Administered 2015-02-18: 2 [IU] via SUBCUTANEOUS
  Administered 2015-02-18: 5 [IU] via SUBCUTANEOUS
  Administered 2015-02-19: 3 [IU] via SUBCUTANEOUS
  Administered 2015-02-20: 5 [IU] via SUBCUTANEOUS
  Administered 2015-02-20: 15 [IU] via SUBCUTANEOUS

## 2015-02-18 MED ORDER — DEXTROSE 50 % IV SOLN
INTRAVENOUS | Status: AC
  Administered 2015-02-18: 50 mL
  Filled 2015-02-18: qty 50

## 2015-02-18 MED ORDER — INSULIN ASPART 100 UNIT/ML ~~LOC~~ SOLN
3.0000 [IU] | Freq: Three times a day (TID) | SUBCUTANEOUS | Status: DC
  Administered 2015-02-18 (×2): 3 [IU] via SUBCUTANEOUS

## 2015-02-18 MED ORDER — METOPROLOL TARTRATE 25 MG PO TABS
25.0000 mg | ORAL_TABLET | Freq: Two times a day (BID) | ORAL | Status: DC
  Administered 2015-02-18 – 2015-02-21 (×6): 25 mg via ORAL
  Filled 2015-02-18 (×6): qty 1

## 2015-02-18 MED ORDER — ASPIRIN 81 MG PO CHEW
81.0000 mg | CHEWABLE_TABLET | ORAL | Status: AC
  Administered 2015-02-19: 81 mg via ORAL
  Filled 2015-02-18: qty 1

## 2015-02-18 MED ORDER — ASPIRIN EC 81 MG PO TBEC
81.0000 mg | DELAYED_RELEASE_TABLET | Freq: Every day | ORAL | Status: DC
  Administered 2015-02-20 – 2015-02-21 (×2): 81 mg via ORAL
  Filled 2015-02-18 (×2): qty 1

## 2015-02-18 MED ORDER — HEPARIN (PORCINE) IN NACL 100-0.45 UNIT/ML-% IJ SOLN
750.0000 [IU]/h | INTRAMUSCULAR | Status: DC
  Administered 2015-02-19: 750 [IU]/h via INTRAVENOUS
  Filled 2015-02-18 (×2): qty 250

## 2015-02-18 MED ORDER — AMIODARONE HCL 200 MG PO TABS
400.0000 mg | ORAL_TABLET | Freq: Two times a day (BID) | ORAL | Status: DC
  Administered 2015-02-18 – 2015-02-21 (×7): 400 mg via ORAL
  Filled 2015-02-18 (×7): qty 2

## 2015-02-18 MED ORDER — DEXTROSE 50 % IV SOLN
INTRAVENOUS | Status: AC
  Administered 2015-02-18: 18:00:00
  Filled 2015-02-18: qty 50

## 2015-02-18 NOTE — Progress Notes (Signed)
ANTICOAGULATION CONSULT NOTE - F/u Consult  Pharmacy Consult for Heparin Indication: atrial fibrillation  No Known Allergies  Patient Measurements: Height: 5\' 7"  (170.2 cm) Weight: 94 lb 5.7 oz (42.8 kg) IBW/kg (Calculated) : 61.6 Heparin Dosing Weight: using total body weight of 42.8 kg  Vital Signs: Temp: 97 F (36.1 C) (12/29 0400) Temp Source: Axillary (12/29 0400) BP: 129/58 mmHg (12/29 0500) Pulse Rate: 59 (12/29 0500)  Labs:  Recent Labs  02/17/15 1407 02/17/15 1725 02/17/15 1847 02/17/15 2245 02/18/15 0552  HGB 9.6*  --   --   --  8.8*  HCT 29.5*  --   --   --  26.6*  PLT 312  --   --   --  311  APTT  --   --  27  --   --   LABPROT  --   --  14.6  --   --   INR  --   --  1.12  --   --   HEPARINUNFRC  --   --   --   --  0.16*  CREATININE 0.56  --   --   --   --   CKTOTAL 30*  --   --   --   --   CKMB 3.2  --   --   --   --   TROPONINI 0.07* 0.06*  --  0.05*  --     Estimated Creatinine Clearance: 36 mL/min (by C-G formula based on Cr of 0.56).   Medical History: Past Medical History  Diagnosis Date  . Anemia   . Hypertension   . Varicose veins   . Arthritis   . PVD (peripheral vascular disease) with claudication ([place])   . Cerebral aneurysm 1993    intracranial hemorrhage  . DJD (degenerative joint disease)   . COPD (chronic obstructive pulmonary disease) ([place])   . Pancreatic cyst   . Thyroid nodule   . Emphysema lung ([place])   . Cataract 2005 and 2006    bilateral  . Hyperlipidemia   . Chronic kidney disease   . Myocardial infarction ([place]) 2009  . Stroke [facility])     2009  . Diabetes mellitus without complication [facility])      Assessment: 48 yoF with h/o MI in 2009, stroke in 2009, HTN, CKD, DM2, COPD, prior cerebral aneurysm with intracranial hemorrhage in 1993, PVD was getting a follow-up EGD and colonoscopy for polypectomy today, during the EGD she became acutely hypoxic followed by atrial fibrillation with RVR. Transferred to telemetry and  this evening had sustained ventricular tachycardia treated with IV metoprolol. Code Blue called when patient BP trended down but converted to NSR and BP improved.  Transferred to ICU and cardiology consulted.  Will start heparin infusion for atrial fibrillation.  Of note, biopsies were taken from gastric mass during the EGD this afternoon so will not provide bolus at initiation of heparin infusion.  Patient was not on anticoagulation prior to admission.  Was taking aspirin 81 mg daily. 12/28 - Baseline CBC shows Hgb 9.6, platelets 312K - Ordered baseline aPTT and PT/INR STAT - CrCl~35 ml/min Today, 12/29 -Hgb=8.8/Plts=311 - HL=0.16, no IV interuptions or bleeding noted per RN  Goal of Therapy:  Heparin level 0.3-0.7 units/ml Monitor platelets by anticoagulation protocol: Yes   Plan:  1.  Increase heparin drip to 750 units/hr 2.  Check heparin level 8 hours after drip increased 3.  Daily heparin level and CBC while on heparin infusion.  Lawana Pai  R 02/18/2015,6:34 AM

## 2015-02-18 NOTE — Progress Notes (Signed)
ANTICOAGULATION CONSULT NOTE - F/u Consult  Pharmacy Consult for Heparin Indication: atrial fibrillation  No Known Allergies  Patient Measurements: Height: 5\' 7"  (170.2 cm) Weight: 94 lb 5.7 oz (42.8 kg) IBW/kg (Calculated) : 61.6 Heparin Dosing Weight: using total body weight of 42.8 kg  Vital Signs: Temp: 98.5 F (36.9 C) (12/29 0800) Temp Source: Oral (12/29 0800) BP: 113/36 mmHg (12/29 1400) Pulse Rate: 51 (12/29 1400)  Labs:  Recent Labs  02/17/15 1407 02/17/15 1725 02/17/15 1847 02/17/15 2245 02/18/15 0552 02/18/15 1420  HGB 9.6*  --   --   --  8.8*  --   HCT 29.5*  --   --   --  26.6*  --   PLT 312  --   --   --  311  --   APTT  --   --  27  --   --   --   LABPROT  --   --  14.6  --   --   --   INR  --   --  1.12  --   --   --   HEPARINUNFRC  --   --   --   --  0.16* 0.31  CREATININE 0.56  --   --   --  0.56  --   CKTOTAL 30*  --   --   --   --   --   CKMB 3.2  --   --   --   --   --   TROPONINI 0.07* 0.06*  --  0.05* 0.05*  --     Estimated Creatinine Clearance: 36 mL/min (by C-G formula based on Cr of 0.56).   Assessment: 42 yoF with h/o MI in 2009, stroke in 2009, HTN, CKD, DM2, COPD, prior cerebral aneurysm with intracranial hemorrhage in 1993, PVD was getting a follow-up EGD and colonoscopy for polypectomy today, during the EGD she became acutely hypoxic followed by atrial fibrillation with RVR. Transferred to telemetry 12/28 PM for sustained ventricular tachycardia treated with IV metoprolol. Code Blue called when patient BP trended down but converted to NSR and BP improved.  Transferred to ICU and cardiology consulted.  Started heparin infusion for atrial fibrillation.  Of note, biopsies were taken from gastric mass during the EGD 12/28 so will not provide bolus at initiation of heparin infusion. GI has clarified in his note that anticoagulation or ASA are OK from his perspective as not a high risk of bleeding and not currently bleeding.  Cardiology rec  MRI head d/t h/o aneursym which may preclude chronic anticoagulation therapy.  Patient was not on anticoagulation prior to admission.  Was taking aspirin 81 mg daily.  Today's labs, 12/29  Heparin level therapeutic on 750 units/hr  CBC: Hgb down from baseline (no bleeding noted), pltc WNL  Goal of Therapy:  Heparin level 0.3-0.7 units/ml Monitor platelets by anticoagulation protocol: Yes   Plan:   continue heparin drip at 750 units/hr  Check heparin level 6-8 hours to confirm therapeutic level  Daily heparin level and CBC while on heparin infusion.  Doreene Eland, PharmD, BCPS.   Pager: [phone] 02/18/2015 3:28 PM

## 2015-02-18 NOTE — Progress Notes (Signed)
Patient Name: Chelsey Bishop Date of Encounter: 02/18/2015  Principal Problem:   Hypoxia Active Problems:   History of gastric ulcer   COPD (chronic obstructive pulmonary disease) (HCC)   Paroxysmal ventricular tachycardia (HCC)   H/O: CVA (cerebrovascular accident)   Atrial fibrillation ([place])   Ventricular tachycardia, sustained ([place])   PAD (peripheral artery disease) ([place])   Status post femoral-popliteal bypass surgery    Primary Cardiologist: [facility] - Dr. Debara Pickett Patient Profile: 81 y.o. Female w/ PMH of tobacco abuse, CAD ([place] in 2009, unsure if she required intervention), CVA, HTN, CKD, Type 2 DM, COPD, prior cerebral aneurysm with hemorrhage in 1993, PVD, and HLD who presented to [facility] on 02/17/2015 for an endoscopy and developed hypoxia, atrial fibrillation with RVR and later a wide-complex tachycardia concerning for VT.   SUBJECTIVE: Converted from atrial fibrillation to NSR overnight. Reports having shortness of breath. Denies any chest pain or palpitations.  OBJECTIVE Filed Vitals:   02/18/15 0500 02/18/15 0600 02/18/15 0700 02/18/15 0800  BP: 129/58 136/46 143/44 143/42  Pulse: 59 58 57 107  Temp:      TempSrc:      Resp: 15 26 19 21   Height:      Weight:      SpO2: 97% 100% 100% 100%    Intake/Output Summary (Last 24 hours) at 02/18/15 [id number] Last data filed at 02/18/15 0800  Gross per 24 hour  Intake 1730.01 ml  Output      0 ml  Net 1730.01 ml   Filed Weights   02/17/15 1221 02/17/15 1622 02/17/15 1745  Weight: 93 lb (42.185 kg) 93 lb 14.7 oz (42.6 kg) 94 lb 5.7 oz (42.8 kg)    PHYSICAL EXAM General: Well developed, well nourished, female in no acute distress. Head: Normocephalic, atraumatic.  Neck: Supple without bruits, JVD not elevated. Lungs:  Resp regular and unlabored, CTA without wheezing or rales. Heart: RRR, S1, S2, no S3, S4, or murmur; no rub. Abdomen: Soft, non-tender, non-distended with normoactive bowel sounds. No hepatomegaly. No  rebound/guarding. No obvious abdominal masses. Extremities: No clubbing, cyanosis, or edema. Distal pedal pulses are 1+ bilaterally. Neuro: Alert and oriented X 2. Moves all extremities spontaneously. Psych: Normal affect.  LABS: CBC: Recent Labs  02/17/15 1407 02/18/15 0552  WBC 15.1* 20.2*  HGB 9.6* 8.8*  HCT 29.5* 26.6*  MCV 96.7 96.7  PLT 312 311   INR: Recent Labs  02/17/15 1847  INR [id number]   Basic Metabolic Panel: Recent Labs  02/17/15 1407 02/18/15 0552  NA 144 139  K 3.8 4.8  CL 101 100*  CO2 31 27  GLUCOSE 223* 248*  BUN 17 14  CREATININE 0.56 0.56  CALCIUM 8.2* 8.2*  MG 1.2*  --    Liver Function Tests: Recent Labs  02/17/15 1407 02/18/15 0552  AST 17 21  ALT 12* 10*  ALKPHOS 46 45  BILITOT 0.3 0.1*  PROT 6.6 6.5  ALBUMIN 3.3* 3.1*   Cardiac Enzymes: Recent Labs  02/17/15 1407 02/17/15 1725 02/17/15 2245 02/18/15 0552  CKTOTAL 30*  --   --   --   CKMB 3.2  --   --   --   TROPONINI 0.07* 0.06* 0.05* 0.05*    TELE: Had episode of asymptomatic sustained VT on 02/17/2015. Was in atrial fibrillation afterwards with conversion to sinus bradycardia with rate in 50's overnight. Occasional PVC's.    ECHO: Pending  Radiology/Studies: Dg Chest Port 1 View: 02/17/2015  CLINICAL DATA:  Shortness of breath.  Hypoxia.  Recent endoscopy. EXAM: PORTABLE CHEST 1 VIEW COMPARISON:  Chest x-ray dated 08/17/2007 and CT scan of the chest dated 02/18/2010 FINDINGS: Heart size and pulmonary vascularity are normal. Extensive calcification in the thoracic aorta. There are no infiltrates or effusions. No pneumothorax. The lungs are slightly hyperinflated with flattening of the diaphragm suggesting emphysema. No acute osseous abnormality. IMPRESSION: No acute abnormality.  Probable COPD.  Aortic atherosclerosis. Electronically Signed   By: Lorriane Shire M.D.   On: 02/17/2015 14:23     Current Medications:  . antiseptic oral rinse  7 mL Mouth Rinse BID  .  atorvastatin  20 mg Oral q1800  . feeding supplement (ENSURE ENLIVE)  237 mL Oral BID BM  . metoprolol  50 mg Oral BID  . NIFEdipine  60 mg Oral Daily  . pantoprazole  40 mg Oral Daily   . amiodarone 30 mg/hr (02/18/15 0800)  . heparin 750 Units/hr (02/18/15 0800)    ASSESSMENT AND PLAN:  1. New Onset Atrial Fibrillation - converted to NSR overnight. - This patients CHA2DS2-VASc Score and unadjusted Ischemic Stroke Rate (% per year) is equal to 10.8 % stroke rate/year from a score of 8 (HTN, DM, Vascular, Female, Age (2), Stroke (2)). Currently on Heparin. Will need to be on long-term anticoagulation. Has a colonic mass which was suppose to be excised yesterday. This will need to be addressed prior to starting long-term anticoagulation. - On Amiodarone 30mg /hr IV. Will convert to PO and start Amiodarone 400mg  BID.  - Continue Metoprolol 50mg  BID. May need to decrease dose if bradycardia persists (in the 50's currently).  2. Sustained Ventricular Tachycardia - cyclic troponin values have been 0.07, 0.06, and 0.05. - echocardiogram is pending - will likely require LHC with her episode of sustained VT and complex medical history. Consider doing tomorrow. Would need to obtain consent from her legal guardian. - continue statin, BB, and Heparin. No ASA at this time due to gastric mass.  Signed, Erma Heritage , PA-C 8:21 AM 02/18/2015 Pager: [phone]

## 2015-02-18 NOTE — Progress Notes (Signed)
Progress Note   Subjective  Patient admitted overnight after experiencing hypoxia during endoscopy with subsequent development of atrial fibrillation and then wide complex tachycardia. She had another run of wide complex tachycardia overnight. She denies chest pains. No abdominal pains. No blood per rectum.    Objective   Vital signs in last 24 hours: Temp:  [97 F (36.1 C)-98.6 F (37 C)] 98.5 F (36.9 C) (12/29 0800) Pulse Rate:  [25-205] 78 (12/29 1000) Resp:  [15-36] 23 (12/29 1000) BP: (109-183)/(31-138) 160/75 mmHg (12/29 1000) SpO2:  [80 %-100 %] 98 % (12/29 1000) Weight:  [93 lb (42.185 kg)-94 lb 5.7 oz (42.8 kg)] 94 lb 5.7 oz (42.8 kg) (12/28 1745) Last BM Date: 02/17/15 General:    white female in NAD Heart:  Irregular rate and rhythm; no murmurs Lungs: Respirations even and unlabored, lungs CTA bilaterally Abdomen:  Soft, nontender and nondistended. Normal bowel sounds. Extremities:  Without edema. Neurologic:  Alert and oriented,  grossly normal neurologically. Psych:  Cooperative. Normal mood and affect.  Intake/Output from previous day: 12/28 0701 - 12/29 0700 In: 1695.8 [P.O.:120; I.V.:1015.8; IV Piggyback:450] Out: -  Intake/Output this shift: Total I/O In: 99.5 [I.V.:69.5; Other:30] Out: -   Lab Results:  Recent Labs  02/17/15 1407 02/18/15 0552  WBC 15.1* 20.2*  HGB 9.6* 8.8*  HCT 29.5* 26.6*  PLT 312 311   BMET  Recent Labs  02/17/15 1407 02/18/15 0552  NA 144 139  K 3.8 4.8  CL 101 100*  CO2 31 27  GLUCOSE 223* 248*  BUN 17 14  CREATININE 0.56 0.56  CALCIUM 8.2* 8.2*   LFT  Recent Labs  02/18/15 0552  PROT 6.5  ALBUMIN 3.1*  AST 21  ALT 10*  ALKPHOS 45  BILITOT 0.1*   PT/INR  Recent Labs  02/17/15 1847  LABPROT 14.6  INR 1.12    Studies/Results: Dg Chest Port 1 View  02/17/2015  CLINICAL DATA:  Shortness of breath.  Hypoxia.  Recent endoscopy. EXAM: PORTABLE CHEST 1 VIEW COMPARISON:  Chest x-ray dated  08/17/2007 and CT scan of the chest dated 02/18/2010 FINDINGS: Heart size and pulmonary vascularity are normal. Extensive calcification in the thoracic aorta. There are no infiltrates or effusions. No pneumothorax. The lungs are slightly hyperinflated with flattening of the diaphragm suggesting emphysema. No acute osseous abnormality. IMPRESSION: No acute abnormality.  Probable COPD.  Aortic atherosclerosis. Electronically Signed   By: Lorriane Shire M.D.   On: 02/17/2015 14:23       Assessment / Plan:   81 y/o female with multiple-comorbidities including COPD, PVD, history of CVA and MI, who underwent EGD yesterday to follow up a history of a gastric ulcer with associated low grade dysplasia. Unfortunately she had hypoxia during the case which initially resolved, however following the EGD she went into atrial fibrillation and then wide complex tachycardia concerning for ventricular tachycardia, which aborted spontaneously. She has been admitted for further cardiac workup.   To clarify results from EGD, she had a healed ulcer in the antrum with some nodular tissue in the area but no significant mass lesion was noted and nothing that was high risk for bleeding. Biopsies obtained, I suspect this could be a precancerous growth but did not appear overtly consistent with gastric cancer. Her colonoscopy was cancelled and was not done, but was to remove a large cecal polyp which is an adenoma, not cancerous that we are aware of. Both of these lesions are not high risk  for bleeding and I think if she needs anticoagulation or regular aspirin, it is okay from my perspective and keep her on a PPI for prophylaxis. She has a chronic anemia of which the etiology is not clear at this time. It was previously attributed to her gastric ulcer but this has since healed and her anemia persists. Her small bowel appears okay on prior CT scan without mass lesion, moving forward she may warrant a capsule study to clear the small  bowel but would hold off for now if she has not had any overt bleeding. From a GI bleeding perspective, I would proceed with anticoagulation if you deem it appropriate from a cardiovascular standpoint, and monitor her Hgb for any evidence of active bleeding.   Please let me know if you have any questions moving forward, I appreciate internal medicine / cardiology assistance in this case.   Nemaha Cellar, MD [facility] Pager [phone]     Principal Problem:   Hypoxia Active Problems:   History of gastric ulcer   COPD (chronic obstructive pulmonary disease) ([place])   Paroxysmal ventricular tachycardia (HCC)   H/O: CVA (cerebrovascular accident)   Atrial fibrillation ([place])   Ventricular tachycardia, sustained ([place])   PAD (peripheral artery disease) ([place])   Status post femoral-popliteal bypass surgery     LOS: 1 day   [place]  02/18/2015, 10:47 AM

## 2015-02-18 NOTE — Progress Notes (Signed)
Chelsey Bishop [id number] DOB: 1929-12-19 DOA: 02/17/2015 PCP: Criselda Peaches, MD  Brief narrative:  81 y/o ? COPD HTn Remote R sided CVA noted 2009 PVD c R Fem-POP Bypass 1995 Brain Aneurysm 1993 DM ty II Prior CDiff colitis ?Prior DVT Pancreatic Pseudocyst 2009 Lingular nodule on CT chest Colonoscopy  EGD 3x20 mm ulcer 9.9.16-low grade dysplasia  Admitted from the endoscopy suite with hypoxia needed to be reintubated and found to be in A. fib 5 PM on 12/28 found to have sustained V. tach versus SVT with aberrancy Cardiology consulted    Past medical history-As per Problem list Chart reviewed as below-   Consultants:  Cardiology  Procedures:  Echo  Antibiotics:  none   Subjective   Alert pleasant but disoriented Thinks that she is in [place] and that the month is Sept No lower extremity swelling Felt terrible overnight Unable to really determine what her review of systems is   Objective    Interim History: None  Telemetry: Sinus rhythm in the 60s with 1.4 second pauses    Objective: Filed Vitals:   02/18/15 0300 02/18/15 0400 02/18/15 0500 02/18/15 0600  BP: 135/31 143/69 129/58 136/46  Pulse: 58  59 58  Temp:  97 F (36.1 C)    TempSrc:  Axillary    Resp: 25 22 15 26   Height:      Weight:      SpO2: 100%  97% 100%    Intake/Output Summary (Last 24 hours) at 02/18/15 0727 Last data filed at 02/18/15 0600  Gross per 24 hour  Intake 1662.51 ml  Output      0 ml  Net 1662.51 ml    Exam:  General: EOMI frail, by temporalis wasting supraclavicular wasting Cardiovascular: S1-S2 no murmur rub or gallop irregularly irregular. No bruit Respiratory: Clinically clear no rales no rhonchi Abdomen: Soft nontender nondistended no rebound Skin no lower extremity edema  Neuro intact  Data Reviewed: Basic Metabolic Panel:  Recent Labs Lab 02/17/15 1407 02/18/15 0552  NA 144 139  K 3.8 4.8  CL 101 100*  CO2 31 27  GLUCOSE  223* 248*  BUN 17 14  CREATININE 0.56 0.56  CALCIUM 8.2* 8.2*  MG 1.2*  --    Liver Function Tests:  Recent Labs Lab 02/17/15 1407 02/18/15 0552  AST 17 21  ALT 12* 10*  ALKPHOS 46 45  BILITOT 0.3 0.1*  PROT 6.6 6.5  ALBUMIN 3.3* 3.1*   No results for input(s): LIPASE, AMYLASE in the last 168 hours. No results for input(s): AMMONIA in the last 168 hours. CBC:  Recent Labs Lab 02/17/15 1407 02/18/15 0552  WBC 15.1* 20.2*  HGB 9.6* 8.8*  HCT 29.5* 26.6*  MCV 96.7 96.7  PLT 312 311   Cardiac Enzymes:  Recent Labs Lab 02/17/15 1407 02/17/15 1725 02/17/15 2245 02/18/15 0552  CKTOTAL 30*  --   --   --   CKMB 3.2  --   --   --   TROPONINI 0.07* 0.06* 0.05* 0.05*   BNP: Invalid input(s): POCBNP CBG:  Recent Labs Lab 02/17/15 1231  GLUCAP 216*    Recent Results (from the past 240 hour(s))  MRSA PCR Screening     Status: None   Collection Time: 02/17/15  5:52 PM  Result Value Ref Range Status   MRSA by PCR NEGATIVE NEGATIVE Final    Comment:        The GeneXpert MRSA Assay (FDA approved for NASAL specimens only),  is one component of a comprehensive MRSA colonization surveillance program. It is not intended to diagnose MRSA infection nor to guide or monitor treatment for MRSA infections.      Studies:              All Imaging reviewed and is as per above notation   Scheduled Meds: . antiseptic oral rinse  7 mL Mouth Rinse BID  . atorvastatin  20 mg Oral q1800  . feeding supplement (ENSURE ENLIVE)  237 mL Oral BID BM  . metoprolol  50 mg Oral BID  . NIFEdipine  60 mg Oral Daily  . pantoprazole  40 mg Oral Daily   Continuous Infusions: . amiodarone 30 mg/hr (02/18/15 [id number])  . heparin 750 Units/hr (02/18/15 0636)     Assessment/Plan:  1. NSVT,  V. tach-management per cardiology [place] 2 score 8,  with amiodarone 30 mg/h, placed on 750 per hour. Continue metoprolol 50 twice a day. Anticoagulation probably somewhat risky. Echo pending.  Appreciate cardiology input.  Keep magnesium above 2, potassium > 4 2. Transient hypoxia-? Underlying COPD, chest x-ray 12/28 benign--no wheeze no rales no rhonchi therefore hold steroids for now. As no cough would also hold antibiotics 3. History of gastric mass/ulcer and colon polyp-as per GI. Hold aspirin and resume when okay with gastroenterology 4. Diabetes mellitus type II-metformin 1.5 g a.m., 1 g p.m. on hold, Amaryl 2 mg on hold-start sliding scale coverage 5. Hyperlipidemia-hold Zocor 40 for now 6. Remote history of right-sided CVA 2009-aspirin as above 7. Hypertension-continue nifedipine 60 daily in addition to above meds 8. Brain aneurysm in 1993-currently stable 9. History of pancreatic pseudocyst-stable  Cardiology consulted Keep on step down/ICU for now Will update family was unable  Verneita Griffes, MD  [facility] Pager [phone] 02/18/2015, 7:27 AM    LOS: 1 day

## 2015-02-18 NOTE — Progress Notes (Signed)
Initial Nutrition Assessment  DOCUMENTATION CODES:   Severe malnutrition in context of chronic illness  INTERVENTION:  -RD to continue to monitor for needs -Pt declined RD interventions at this time   NUTRITION DIAGNOSIS:   Malnutrition related to poor appetite, chronic illness as evidenced by per patient/family report, severe depletion of muscle mass, severe depletion of body fat.  GOAL:   Patient will meet greater than or equal to 90% of their needs  MONITOR:   PO intake, I & O's, Labs, Skin  REASON FOR ASSESSMENT:   Malnutrition Screening Tool    ASSESSMENT:   Chelsey Bishop is a 81 y.o. female with PMH of COPD, H/o CVA, HTN, PVD, H/o brain aneurysm in 1993. Was getting a follow-up EGD and colonoscopy for polypectomy today, her EGD was done under MAC with propofol, during the EGD she became acutely hypoxic with O2 sats in the 60s, the scope was withdrawn, her sats recovered to high 80s/90s, endoscope was reinserted and biopsies taken of a gastric mass, subsequently patient became significantly hypoxic again. This was followed by atrial fibrillation, then ventricular tachycardia on the monitor, she remained responsive and never lost her pulse, then went back into atrial fibrillation. Colonoscopy was obviously postponed  Spoke with pt at bedside. She reports a good appetite prior to admission. Denies wt loss, but has had a 5#/5% wt loss in 4 months. Pt does appear severely malnourished, severe loss of fat and muscle mass. Likely related to hx of CVA/ COPD but pt denies any issues with shortness of breath while eating. During visit pt was eating, and had eaten most food on her tray. Will continue to monitor PO intake. Suggested ONS during stay but pt wanted to focus on food.  Labs and Medications reviewed.  Diet Order:  Diet Heart Room service appropriate?: Yes; Fluid consistency:: Thin  Skin:  Reviewed, no issues  Last BM:  02/17/2015  Height:   Ht Readings from Last 1  Encounters:  02/17/15 5\' 7"  (1.702 m)    Weight:   Wt Readings from Last 1 Encounters:  02/17/15 94 lb 5.7 oz (42.8 kg)    Ideal Body Weight:  61.36 kg  BMI:  Body mass index is 14.77 kg/(m^2).  Estimated Nutritional Needs:   Kcal:  1500-1700 calories  Protein:  45-55 grams  Fluid:  >/= 1.5L  EDUCATION NEEDS:   No education needs identified at this time  Chelsey Bishop. Chelsey Gilmer, MS, RD LDN After Hours/Weekend Pager [phone]

## 2015-02-18 NOTE — Progress Notes (Signed)
  Echocardiogram 2D Echocardiogram has been performed.  Jennette Dubin 02/18/2015, 9:09 AM

## 2015-02-18 NOTE — Progress Notes (Signed)
Hypoglycemic Event  CBG: 24  Treatment: D50 IV 50 mL  Symptoms: Sweaty  Follow-up CBG: Time: [id number] CBG Result: 170  Possible Reasons for Event: Inadequate meal intake  Comments/MD notified:Dr. Verlon Au notified  Rhina Brackett

## 2015-02-19 ENCOUNTER — Encounter (HOSPITAL_COMMUNITY)
Admission: RE | Disposition: A | Discharge status: Home / Self Care | Payer: Self-pay | Source: Ambulatory Visit | Attending: Family Medicine

## 2015-02-19 DIAGNOSIS — Z8679 Personal history of other diseases of the circulatory system: Secondary | ICD-10-CM | POA: Insufficient documentation

## 2015-02-19 HISTORY — PX: CARDIAC CATHETERIZATION: SHX172

## 2015-02-19 LAB — CBC
HEMATOCRIT: 27.4 % — AB (ref 36.0–46.0)
Hemoglobin: 9 g/dL — ABNORMAL LOW (ref 12.0–15.0)
MCH: 32.1 pg (ref 26.0–34.0)
MCHC: 32.8 g/dL (ref 30.0–36.0)
MCV: 97.9 fL (ref 78.0–100.0)
PLATELETS: 300 10*3/uL (ref 150–400)
RBC: 2.8 MIL/uL — AB (ref 3.87–5.11)
RDW: 17.2 % — ABNORMAL HIGH (ref 11.5–15.5)
WBC: 11.6 10*3/uL — AB (ref 4.0–10.5)

## 2015-02-19 LAB — BASIC METABOLIC PANEL
ANION GAP: 9 (ref 5–15)
BUN: 14 mg/dL (ref 6–20)
CALCIUM: 8.3 mg/dL — AB (ref 8.9–10.3)
CO2: 31 mmol/L (ref 22–32)
CREATININE: 0.54 mg/dL (ref 0.44–1.00)
Chloride: 100 mmol/L — ABNORMAL LOW (ref 101–111)
GLUCOSE: 110 mg/dL — AB (ref 65–99)
Potassium: 4.5 mmol/L (ref 3.5–5.1)
Sodium: 140 mmol/L (ref 135–145)

## 2015-02-19 LAB — LIPID PANEL
CHOLESTEROL: 80 mg/dL (ref 0–200)
HDL: 44 mg/dL (ref >40)
LDL Cholesterol: 24 mg/dL (ref 0–99)
TRIGLYCERIDES: 62 mg/dL (ref <150)
Total CHOL/HDL Ratio: 1.8 RATIO
VLDL: 12 mg/dL (ref 0–40)

## 2015-02-19 LAB — GLUCOSE, CAPILLARY
GLUCOSE-CAPILLARY: 113 mg/dL — AB (ref 65–99)
GLUCOSE-CAPILLARY: 123 mg/dL — AB (ref 65–99)
GLUCOSE-CAPILLARY: 227 mg/dL — AB (ref 65–99)
GLUCOSE-CAPILLARY: 325 mg/dL — AB (ref 65–99)
GLUCOSE-CAPILLARY: 70 mg/dL (ref 65–99)
GLUCOSE-CAPILLARY: 80 mg/dL (ref 65–99)

## 2015-02-19 LAB — PROTIME-INR
INR: 1.12 (ref 0.00–1.49)
Prothrombin Time: 14.6 seconds (ref 11.6–15.2)

## 2015-02-19 LAB — TSH: TSH: 1.869 u[IU]/mL (ref 0.350–4.500)

## 2015-02-19 LAB — MAGNESIUM: Magnesium: 1.5 mg/dL — ABNORMAL LOW (ref 1.7–2.4)

## 2015-02-19 LAB — HEPARIN LEVEL (UNFRACTIONATED): Heparin Unfractionated: 0.38 IU/mL (ref 0.30–0.70)

## 2015-02-19 SURGERY — LEFT HEART CATH AND CORONARY ANGIOGRAPHY
Anesthesia: LOCAL

## 2015-02-19 MED ORDER — ACETAMINOPHEN 325 MG PO TABS: 650.0000 mg | ORAL_TABLET | ORAL | Status: DC | PRN

## 2015-02-19 MED ORDER — MAGNESIUM SULFATE 2 GM/50ML IV SOLN
2.0000 g | Freq: Once | INTRAVENOUS | Status: AC
  Administered 2015-02-19: 2 g via INTRAVENOUS
  Filled 2015-02-19: qty 50

## 2015-02-19 MED ORDER — VERAPAMIL HCL 2.5 MG/ML IV SOLN
INTRAVENOUS | Status: DC | PRN
  Administered 2015-02-19: 17:00:00 via INTRA_ARTERIAL

## 2015-02-19 MED ORDER — SODIUM CHLORIDE 0.9 % WEIGHT BASED INFUSION: 1.0000 mL/kg/h | INTRAVENOUS | Status: DC

## 2015-02-19 MED ORDER — SODIUM CHLORIDE 0.9 % IV SOLN: 250.0000 mL | INTRAVENOUS | Status: DC | PRN

## 2015-02-19 MED ORDER — HEPARIN SODIUM (PORCINE) 1000 UNIT/ML IJ SOLN
INTRAMUSCULAR | Status: DC | PRN
  Administered 2015-02-19: 3000 [IU] via INTRAVENOUS

## 2015-02-19 MED ORDER — SODIUM CHLORIDE 0.9 % IV SOLN: INTRAVENOUS | Status: AC

## 2015-02-19 MED ORDER — SODIUM CHLORIDE 0.9 % IJ SOLN: 3.0000 mL | INTRAMUSCULAR | Status: DC | PRN

## 2015-02-19 MED ORDER — ONDANSETRON HCL 4 MG/2ML IJ SOLN: 4.0000 mg | Freq: Four times a day (QID) | INTRAMUSCULAR | Status: DC | PRN

## 2015-02-19 MED ORDER — HEPARIN (PORCINE) IN NACL 100-0.45 UNIT/ML-% IJ SOLN
750.0000 [IU]/h | INTRAMUSCULAR | Status: DC
  Administered 2015-02-20: 750 [IU]/h via INTRAVENOUS
  Filled 2015-02-19 (×2): qty 250

## 2015-02-19 MED ORDER — VERAPAMIL HCL 2.5 MG/ML IV SOLN
INTRAVENOUS | Status: AC
Start: 2015-02-19 — End: 2015-02-19
  Filled 2015-02-19: qty 2

## 2015-02-19 MED ORDER — SODIUM CHLORIDE 0.9 % WEIGHT BASED INFUSION: 3.0000 mL/kg/h | INTRAVENOUS | Status: DC

## 2015-02-19 MED ORDER — IOHEXOL 350 MG/ML SOLN
INTRAVENOUS | Status: DC | PRN
  Administered 2015-02-19: 20 mL via INTRACARDIAC

## 2015-02-19 MED ORDER — SODIUM CHLORIDE 0.9 % IJ SOLN
3.0000 mL | INTRAMUSCULAR | Status: DC | PRN
  Administered 2015-02-19: 10:00:00 via INTRAVENOUS
  Filled 2015-02-19: qty 3

## 2015-02-19 MED ORDER — SODIUM CHLORIDE 0.9 % IJ SOLN: 3.0000 mL | Freq: Two times a day (BID) | INTRAMUSCULAR | Status: DC

## 2015-02-19 MED ORDER — HEPARIN SODIUM (PORCINE) 1000 UNIT/ML IJ SOLN
INTRAMUSCULAR | Status: AC
  Filled 2015-02-19: qty 1

## 2015-02-19 MED ORDER — SODIUM CHLORIDE 0.9 % IJ SOLN
3.0000 mL | Freq: Two times a day (BID) | INTRAMUSCULAR | Status: DC
  Administered 2015-02-20: 3 mL via INTRAVENOUS

## 2015-02-19 MED ORDER — LIDOCAINE HCL (PF) 1 % IJ SOLN
INTRAMUSCULAR | Status: DC | PRN
  Administered 2015-02-19: 17:00:00

## 2015-02-19 SURGICAL SUPPLY — 11 items
CATH INFINITI 5 FR JL3.5 (CATHETERS) ×2 IMPLANT
CATH INFINITI 5FR ANG PIGTAIL (CATHETERS) ×2 IMPLANT
CATH INFINITI JR4 5F (CATHETERS) ×2 IMPLANT
DEVICE RAD COMP TR BAND LRG (VASCULAR PRODUCTS) ×2 IMPLANT
GLIDESHEATH SLEND SS 6F .021 (SHEATH) ×2 IMPLANT
KIT HEART LEFT (KITS) ×2 IMPLANT
PACK CARDIAC CATHETERIZATION (CUSTOM PROCEDURE TRAY) ×2 IMPLANT
TRANSDUCER W/STOPCOCK (MISCELLANEOUS) ×2 IMPLANT
TUBING CIL FLEX 10 FLL-RA (TUBING) ×2 IMPLANT
WIRE HI TORQ VERSACORE-J 145CM (WIRE) ×2 IMPLANT
WIRE SAFE-T 1.5MM-J .035X260CM (WIRE) ×2 IMPLANT

## 2015-02-19 NOTE — Progress Notes (Signed)
Pt to procedure area for cath. 

## 2015-02-19 NOTE — Progress Notes (Signed)
TR BAND REMOVAL  LOCATION:    Radial right  DEFLATED PER PROTOCOL:   yes  TIME BAND OFF / DRESSING APPLIED:    1940, small tegaderm  SITE UPON ARRIVAL:    Level  0  SITE AFTER BAND REMOVAL:    Level  0  CIRCULATION SENSATION AND MOVEMENT:    Within Normal Limits :  Present, rt hand and fingers remain warm and pink  COMMENTS:

## 2015-02-19 NOTE — Discharge Instructions (Signed)
Radial Site Care °Refer to this sheet in the next few weeks. These instructions provide you with information about caring for yourself after your procedure. Your health care provider may also give you more specific instructions. Your treatment has been planned according to current medical practices, but problems sometimes occur. Call your health care provider if you have any problems or questions after your procedure. °WHAT TO EXPECT AFTER THE PROCEDURE °After your procedure, it is typical to have the following: °· Bruising at the radial site that usually fades within 1-2 weeks. °· Blood collecting in the tissue (hematoma) that may be painful to the touch. It should usually decrease in size and tenderness within 1-2 weeks. °HOME CARE INSTRUCTIONS °· Take medicines only as directed by your health care provider. °· You may shower 24-48 hours after the procedure or as directed by your health care provider. Remove the bandage (dressing) and gently wash the site with plain soap and water. Pat the area dry with a clean towel. Do not rub the site, because this may cause bleeding. °· Do not take baths, swim, or use a hot tub until your health care provider approves. °· Check your insertion site every day for redness, swelling, or drainage. °· Do not apply powder or lotion to the site. °· Do not flex or bend the affected arm for 24 hours or as directed by your health care provider. °· Do not push or pull heavy objects with the affected arm for 24 hours or as directed by your health care provider. °· Do not lift over 10 lb (4.5 kg) for 5 days after your procedure or as directed by your health care provider. °· Ask your health care provider when it is okay to: °¨ Return to work or school. °¨ Resume usual physical activities or sports. °¨ Resume sexual activity. °· Do not drive home if you are discharged the same day as the procedure. Have someone else drive you. °· You may drive 24 hours after the procedure unless otherwise  instructed by your health care provider. °· Do not operate machinery or power tools for 24 hours after the procedure. °· If your procedure was done as an outpatient procedure, which means that you went home the same day as your procedure, a responsible adult should be with you for the first 24 hours after you arrive home. °· Keep all follow-up visits as directed by your health care provider. This is important. °SEEK MEDICAL CARE IF: °· You have a fever. °· You have chills. °· You have increased bleeding from the radial site. Hold pressure on the site. °SEEK IMMEDIATE MEDICAL CARE IF: °· You have unusual pain at the radial site. °· You have redness, warmth, or swelling at the radial site. °· You have drainage (other than a small amount of blood on the dressing) from the radial site. °· The radial site is bleeding, and the bleeding does not stop after 30 minutes of holding steady pressure on the site. °· Your arm or hand becomes pale, cool, tingly, or numb. °  °This information is not intended to replace advice given to you by your health care provider. Make sure you discuss any questions you have with your health care provider. °  °Document Released: 03/11/2010 Document Revised: 02/27/2014 Document Reviewed: 08/25/2013 °Elsevier Interactive Patient Education ©2016 [employer] ° °

## 2015-02-19 NOTE — Progress Notes (Signed)
Chelsey Bishop [id number] DOB: 1929-10-12 DOA: 02/17/2015 PCP: Criselda Peaches, MD  Brief narrative:  81 y/o ? COPD HTn Remote R sided CVA noted 2009 PVD c R Fem-POP Bypass 1995 Brain Aneurysm 1993 DM ty II Prior CDiff colitis ?Prior DVT Pancreatic Pseudocyst 2009 Lingular nodule on CT chest Colonoscopy  EGD 3x20 mm ulcer 9.9.16-low grade dysplasia  Admitted from the endoscopy suite with hypoxia needed to be reintubated and found to be in A. fib 5 PM on 12/28 found to have sustained V. tach versus SVT with aberrancy Cardiology consulted Cath planned for 12/30 pm given decreased EF    Past medical history-As per Problem list Chart reviewed as below-   Consultants:  Cardiology  Procedures:  Echo  Antibiotics:  none   Subjective   Alert pleasant  denies n/v/cp/diarr No baseline O2 use    Objective    Interim History: None  Telemetry: Some brady in 50's    Objective: Filed Vitals:   02/19/15 0400 02/19/15 0500 02/19/15 0600 02/19/15 0800  BP: 112/49  126/42 132/46  Pulse: 52 50 58 55  Temp: 97.9 F (36.6 C)   98.2 F (36.8 C)  TempSrc: Oral   Oral  Resp: 18 27 26 24   Height:      Weight:      SpO2: 99% 94% 98% 95%    Intake/Output Summary (Last 24 hours) at 02/19/15 1126 Last data filed at 02/19/15 0815  Gross per 24 hour  Intake 988.75 ml  Output     50 ml  Net 938.75 ml    Exam:  General: EOMI frail, by temporalis wasting supraclavicular wasting Cardiovascular: S1-S2 nsr. No bruit Respiratory: Clinically clear no rales no rhonchi Abdomen: Soft nontender nondistended no rebound Skin no lower extremity edema  Neuro intact  Data Reviewed: Basic Metabolic Panel:  Recent Labs Lab 02/17/15 1407 02/18/15 0552 02/19/15 0335  NA 144 139 140  K 3.8 4.8 4.5  CL 101 100* 100*  CO2 31 27 31   GLUCOSE 223* 248* 110*  BUN 17 14 14   CREATININE 0.56 0.56 0.54  CALCIUM 8.2* 8.2* 8.3*  MG 1.2*  --  1.5*   Liver Function  Tests:  Recent Labs Lab 02/17/15 1407 02/18/15 0552  AST 17 21  ALT 12* 10*  ALKPHOS 46 45  BILITOT 0.3 0.1*  PROT 6.6 6.5  ALBUMIN 3.3* 3.1*   No results for input(s): LIPASE, AMYLASE in the last 168 hours. No results for input(s): AMMONIA in the last 168 hours. CBC:  Recent Labs Lab 02/17/15 1407 02/18/15 0552 02/19/15 0335  WBC 15.1* 20.2* 11.6*  HGB 9.6* 8.8* 9.0*  HCT 29.5* 26.6* 27.4*  MCV 96.7 96.7 97.9  PLT 312 311 300   Cardiac Enzymes:  Recent Labs Lab 02/17/15 1407 02/17/15 1725 02/17/15 2245 02/18/15 0552  CKTOTAL 30*  --   --   --   CKMB 3.2  --   --   --   TROPONINI 0.07* 0.06* 0.05* 0.05*   BNP: Invalid input(s): POCBNP CBG:  Recent Labs Lab 02/18/15 2044 02/18/15 2107 02/18/15 2330 02/19/15 0429 02/19/15 0744  GLUCAP 30* 223* 70 113* 123*    Recent Results (from the past 240 hour(s))  MRSA PCR Screening     Status: None   Collection Time: 02/17/15  5:52 PM  Result Value Ref Range Status   MRSA by PCR NEGATIVE NEGATIVE Final    Comment:        The GeneXpert MRSA Assay (  FDA approved for NASAL specimens only), is one component of a comprehensive MRSA colonization surveillance program. It is not intended to diagnose MRSA infection nor to guide or monitor treatment for MRSA infections.      Studies:              All Imaging reviewed and is as per above notation   Scheduled Meds: . amiodarone  400 mg Oral BID  . antiseptic oral rinse  7 mL Mouth Rinse BID  . [START ON 02/20/2015] aspirin EC  81 mg Oral Daily  . atorvastatin  20 mg Oral q1800  . feeding supplement (ENSURE ENLIVE)  237 mL Oral BID BM  . insulin aspart  0-9 Units Subcutaneous TID WC  . insulin aspart  3 Units Subcutaneous TID WC  . metoprolol  25 mg Oral BID  . NIFEdipine  60 mg Oral Daily  . pantoprazole  40 mg Oral Daily  . sodium chloride  3 mL Intravenous Q12H   Continuous Infusions: . sodium chloride 1 mL/kg/hr (02/19/15 0850)  . heparin 750  Units/hr (02/18/15 1800)     Assessment/Plan:  1. NSVT,  V. tach-management per cardiology [place] 2 score 8,  with amiodarone 30 mg/h-->Amiodarone 400 bid. Cards ?metoprolol 50 twice-->25 bid 12/30. Anticoagulation probably okay per GI Echo EF=65-70% Grade 2 dd Appreciate cardiology input.  Keep magnesium above 2, potassium > 4 2. Transient hypoxia-? Underlying COPD, chest x-ray 12/28 benign--no wheeze no rales no rhonchi therefore hold steroids for now. As no cough would also hold antibiotics.  desat screen today 3. History of gastric ulcer and colon polyp-as per GI. Hold Asa  for now 4. Diabetes mellitus type II-metformin 1.5 g a.m., 1 g p.m. on hold, Amaryl 2 mg on hold-low blood sugar 12/30 so holding mealtime but cont scale coverage.  CBg 70-123 5. Hyperlipidemia-hold Zocor 40 for now 6. Remote history of right-sided CVA 2009-aspirin as above 7. Hypertension-continue nifedipine 60 daily in addition to above meds 8. Brain aneurysm in 1993-currently stable 9. History of pancreatic pseudocyst-stable   Cardiology consulted Transfer tele after cath   Verneita Griffes, MD  [facility] Pager [phone] 02/19/2015, 11:26 AM    LOS: 2 days

## 2015-02-19 NOTE — Progress Notes (Signed)
Inpatient Diabetes Program Recommendations  AACE/ADA: New Consensus Statement on Inpatient Glycemic Control (2015)  Target Ranges:  Prepandial:   less than 140 mg/dL      Peak postprandial:   less than 180 mg/dL (1-2 hours)      Critically ill patients:  140 - 180 mg/dL    Results for Chelsey Bishop, Chelsey Bishop (MRN [id number]) as of 02/19/2015 10:36  Ref. Range 02/18/2015 12:43 02/18/2015 16:14 02/18/2015 18:12 02/18/2015 18:15 02/18/2015 18:32 02/18/2015 20:44 02/18/2015 21:07 02/18/2015 23:30  Glucose-Capillary Latest Ref Range: 65-99 mg/dL 290 (H) 189 (H) 24 (LL) 24 (LL) 170 (H) 30 (LL) 223 (H) 70     Home DM Meds: Amaryl 2 mg daily       Metformin 1500 mg AM/ 1000 mg PM  Current Insulin Orders: Novolog Sensitive SSI (0-9 units) TID AC       Novolog 3 units tidwc      MD- Patient with Severe Hypoglycemia at 6PM and 8PM last night after getting Novolog SSI + Novolog 3 units Meal Coverage.  Please consider discontinuation of Novolog 3 units tidwc (meal coverage) for now.    Please continue Novolog Sensitive SSI.    --Will follow patient during hospitalization--  Wyn Quaker RN, MSN, CDE Diabetes Coordinator Inpatient Glycemic Control Team Team Pager: [phone] (8a-5p)

## 2015-02-19 NOTE — Progress Notes (Signed)
Patient Name: Chelsey Bishop Date of Encounter: 02/19/2015  Principal Problem:   Hypoxia Active Problems:   History of gastric ulcer   COPD (chronic obstructive pulmonary disease) (HCC)   Paroxysmal ventricular tachycardia (HCC)   H/O: CVA (cerebrovascular accident)   Atrial fibrillation ([place])   Ventricular tachycardia, sustained ([place])   PAD (peripheral artery disease) ([place])   Status post femoral-popliteal bypass surgery   V-tach [facility])   Primary Cardiologist: Dr Debara Pickett  Patient Profile: 81 y.o. Female w/ PMH of tobacco abuse, CAD (Mi in 2009, unsure if she required intervention), CVA, HTN, CKD, Type 2 DM, COPD, prior cerebral aneurysm with hemorrhage in 1993, PVD, and HLD, R fem-pop 1995, admitted 02/17/2015 for an endoscopy and developed hypoxia, atrial fibrillation with RVR and later a wide-complex tachycardia concerning for VT (BC did consult 2009 for probable VT and PAF in the setting of sepsis), minimal elevation in cardiac enzymes at that time, normal MV.  SUBJECTIVE: Pt feels well, denies chest pain or SOB. Agreeable to cath, daughter has signed consent. Not on home O2.   OBJECTIVE Filed Vitals:   02/19/15 0300 02/19/15 0400 02/19/15 0500 02/19/15 0600  BP:  112/49  126/42  Pulse: 52 52 50 58  Temp:  97.9 F (36.6 C)    TempSrc:  Oral    Resp: 18 18 27 26   Height:      Weight:      SpO2: 97% 99% 94% 98%    Intake/Output Summary (Last 24 hours) at 02/19/15 0648 Last data filed at 02/19/15 0600  Gross per 24 hour  Intake 1074.09 ml  Output      0 ml  Net 1074.09 ml   Filed Weights   02/17/15 1221 02/17/15 1622 02/17/15 1745  Weight: 93 lb (42.185 kg) 93 lb 14.7 oz (42.6 kg) 94 lb 5.7 oz (42.8 kg)    PHYSICAL EXAM General: Well developed, well nourished, female in no acute distress. Head: Normocephalic, atraumatic.  Neck: Supple without bruits, JVD 8-9 cm. Lungs:  Resp regular and unlabored, rales bases, slight wheeze. Heart: RRR, S1, S2, no S3, S4, or  murmur; no rub. Abdomen: Soft, non-tender, non-distended, BS + x 4.  Extremities: No clubbing, cyanosis, edema.  Neuro: Alert and oriented X 2. Moves all extremities spontaneously. Psych: Normal affect.  LABS: CBC:  Recent Labs  02/18/15 0552 02/19/15 0335  WBC 20.2* 11.6*  HGB 8.8* 9.0*  HCT 26.6* 27.4*  MCV 96.7 97.9  PLT 311 300   INR:  Recent Labs  02/19/15 0335  INR [id number]   Basic Metabolic Panel:  Recent Labs  02/17/15 1407 02/18/15 0552 02/19/15 0335  NA 144 139 140  K 3.8 4.8 4.5  CL 101 100* 100*  CO2 31 27 31   GLUCOSE 223* 248* 110*  BUN 17 14 14   CREATININE 0.56 0.56 0.54  CALCIUM 8.2* 8.2* 8.3*  MG 1.2*  --  1.5*   Liver Function Tests:  Recent Labs  02/17/15 1407 02/18/15 0552  AST 17 21  ALT 12* 10*  ALKPHOS 46 45  BILITOT 0.3 0.1*  PROT 6.6 6.5  ALBUMIN 3.3* 3.1*   Cardiac Enzymes:  Recent Labs  02/17/15 1407 02/17/15 1725 02/17/15 2245 02/18/15 0552  CKTOTAL 30*  --   --   --   CKMB 3.2  --   --   --   TROPONINI 0.07* 0.06* 0.05* 0.05*   Thyroid Function Tests:  Recent Labs  02/19/15 0335  TSH 1.869  TELE: SR, S brady in the 40s overnight, PVCs and PACs; 3 bt run ?NSVT      ECHO 2023/03/14 - Left ventricle: The cavity size was normal. There was mild concentric hypertrophy. Systolic function was vigorous. The estimated ejection fraction was in the range of 65% to 70%. Wall motion was normal; there were no regional wall motion abnormalities. Features are consistent with a pseudonormal left ventricular filling pattern, with concomitant abnormal relaxation and increased filling pressure (grade 2 diastolic dysfunction). Doppler parameters are consistent with elevated ventricular end-diastolic filling pressure. - Aortic valve: Trileaflet; mildly thickened, mildly calcified leaflets. Transvalvular velocity was minimally increased. There was no stenosis. There was no regurgitation. - Aortic root: The  aortic root was normal in size. - Mitral valve: Calcified annulus. Mildly thickened leaflets . - Left atrium: The atrium was mildly dilated. - Right ventricle: Systolic function was normal. - Tricuspid valve: There was moderate regurgitation. - Pulmonic valve: There was no regurgitation. - Pulmonary arteries: Systolic pressure was mildly to moderately increased. PA peak pressure: 45 mm Hg (S). - Inferior vena cava: The vessel was normal in size. - Pericardium, extracardiac: There was no pericardial effusion.   Radiology/Studies: Mr Virgel Paling [id number] Contrast 2015-03-14  CLINICAL DATA:  History of aneurysm. No previous imaging available to less. EXAM: MRA HEAD WITHOUT CONTRAST TECHNIQUE: Angiographic images of the Circle of Willis were obtained using MRA technique without intravenous contrast. COMPARISON:  None. FINDINGS: Both internal carotid arteries are patent through the skullbase and siphon regions. There is moderate atherosclerotic narrowing in the carotid siphon regions, estimated at 40-50% on the right and 20-30% on the left. On the left, the anterior and middle cerebral arteries are patent without proximal stenosis, aneurysm or vascular malformation. On the right, the anterior cerebral artery appears normal. There is no abnormality of the M1 segment which looks like a severe stenosis. There is slight dilatation of the vessels in that region, but I can not diagnosis this as an aneurysm. Beyond that, the vessel appears widely patent. Both vertebral arteries are patent with the right being dominant. No basilar stenosis. Posterior circulation branch vessels are patent. There is some atherosclerotic irregularity of the more distal PCA branch is. IMPRESSION: No definable intracranial aneurysm. There is an abnormality of the M1 segment of the middle cerebral artery on the right most consistent with a severe stenosis. This would place the patient at risk of right MCA infarction. In the region of the  stenosis, the vessel does show mild dilatation, but the appearance is not consistent with a berry aneurysm. Electronically Signed   By: Nelson Chimes M.D.   On: 03-14-15 17:52   Dg Chest Port 1 View 02/17/2015  CLINICAL DATA:  Shortness of breath.  Hypoxia.  Recent endoscopy. EXAM: PORTABLE CHEST 1 VIEW COMPARISON:  Chest x-ray dated 08/17/2007 and CT scan of the chest dated 02/18/2010 FINDINGS: Heart size and pulmonary vascularity are normal. Extensive calcification in the thoracic aorta. There are no infiltrates or effusions. No pneumothorax. The lungs are slightly hyperinflated with flattening of the diaphragm suggesting emphysema. No acute osseous abnormality. IMPRESSION: No acute abnormality.  Probable COPD.  Aortic atherosclerosis. Electronically Signed   By: Lorriane Shire M.D.   On: 02/17/2015 14:23     Current Medications:  . amiodarone  400 mg Oral BID  . antiseptic oral rinse  7 mL Mouth Rinse BID  . [START ON 02/20/2015] aspirin EC  81 mg Oral Daily  . atorvastatin  20 mg Oral q1800  .  feeding supplement (ENSURE ENLIVE)  237 mL Oral BID BM  . insulin aspart  0-9 Units Subcutaneous TID WC  . insulin aspart  3 Units Subcutaneous TID WC  . magnesium sulfate 1 - 4 g bolus IVPB  2 g Intravenous Once  . metoprolol  25 mg Oral BID  . NIFEdipine  60 mg Oral Daily  . pantoprazole  40 mg Oral Daily   . heparin 750 Units/hr (02/18/15 1800)    ASSESSMENT AND PLAN: 1. New Onset Atrial Fibrillation - converted to NSR overnight. - This patients CHA2DS2-VASc Score and unadjusted Ischemic Stroke Rate (% per year) is equal to 10.8 % stroke rate/year from a score of 8 (HTN, DM, Vascular, Female, Age (2), Stroke (2)). Currently on Heparin. Will need to be on long-term anticoagulation. Has a colonic mass which was suppose to be excised yesterday. This will need to be addressed prior to starting long-term anticoagulation. - IV Amiodarone>>400mg  BID PO on 12/29 - Metoprolol 50mg  BID>>25 mg bid  12/29 2nd bradycardia.  - got 25 mg metoprolol 12/29 pm, HR 40s overnight, pt asymptomatic - may need to reduce dose further  2. Sustained Ventricular Tachycardia - cyclic troponin values have been 0.07, 0.06, and 0.05. - echocardiogram w/ normal EF -  LHC planned with sustained VT and complex medical history.  - continue statin, BB, and Heparin. No ASA at this time due to gastric mass.  Principal Problem: 3. Hypoxia - per IM - Will have to be careful with sedation as she became hypoxic when sedated for the EGD - slight wheeze today, discuss nebs with MI - was not on home O2 PTA  4. PAD - S/P R fem-pop 1995 or 1999 - radial access preferred - last note from Dr Donnetta Hutching is 2010 - 2010 DUPLEX: Doppler arterial waveforms are biphasic proximal to, within and monophasic distal to the right fem-pop bypass graft with an elevated peak systolic velocity of 123456 cm/second in the right external iliac artery. IMPRESSION: 1. ABIs are stable from previous study bilaterally. 2. Patent right fem-pop bypass graft. 3. 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  Otherwise, per IM Active Problems:   History of gastric ulcer   COPD (chronic obstructive pulmonary disease) (HCC)   Paroxysmal ventricular tachycardia (HCC)   H/O: CVA (cerebrovascular accident)   Atrial fibrillation (HCC)   Ventricular tachycardia, sustained ([place])   PAD (peripheral artery disease) ([place])   Status post femoral-popliteal bypass surgery   V-tach [facility])   Signed, Rosaria Ferries , PA-C 6:48 AM 02/19/2015

## 2015-02-19 NOTE — Progress Notes (Signed)
ANTICOAGULATION CONSULT NOTE - F/u Consult  Pharmacy Consult for Heparin Indication: atrial fibrillation  No Known Allergies  Patient Measurements: Height: 5\' 7"  (170.2 cm) Weight: 94 lb 5.7 oz (42.8 kg) IBW/kg (Calculated) : 61.6 Heparin Dosing Weight: using total body weight of 42.8 kg  Vital Signs: Temp: 97.5 F (36.4 C) (12/30 0000) Temp Source: Oral (12/30 0000) BP: 120/52 mmHg (12/29 2200) Pulse Rate: 52 (12/29 2200)  Labs:  Recent Labs  02/17/15 1407 02/17/15 1725 02/17/15 1847 02/17/15 2245 02/18/15 0552 02/18/15 1420 02/19/15 0010  HGB 9.6*  --   --   --  8.8*  --   --   HCT 29.5*  --   --   --  26.6*  --   --   PLT 312  --   --   --  311  --   --   APTT  --   --  27  --   --   --   --   LABPROT  --   --  14.6  --   --   --   --   INR  --   --  1.12  --   --   --   --   HEPARINUNFRC  --   --   --   --  0.16* 0.31 0.38  CREATININE 0.56  --   --   --  0.56  --   --   CKTOTAL 30*  --   --   --   --   --   --   CKMB 3.2  --   --   --   --   --   --   TROPONINI 0.07* 0.06*  --  0.05* 0.05*  --   --     Estimated Creatinine Clearance: 36 mL/min (by C-G formula based on Cr of 0.56).   Assessment: 24 yoF with h/o MI in 2009, stroke in 2009, HTN, CKD, DM2, COPD, prior cerebral aneurysm with intracranial hemorrhage in 1993, PVD was getting a follow-up EGD and colonoscopy for polypectomy today, during the EGD she became acutely hypoxic followed by atrial fibrillation with RVR. Transferred to telemetry 12/28 PM for sustained ventricular tachycardia treated with IV metoprolol. Code Blue called when patient BP trended down but converted to NSR and BP improved.  Transferred to ICU and cardiology consulted.  Started heparin infusion for atrial fibrillation.  Of note, biopsies were taken from gastric mass during the EGD 12/28 so will not provide bolus at initiation of heparin infusion. GI has clarified in his note that anticoagulation or ASA are OK from his perspective as not  a high risk of bleeding and not currently bleeding.  Cardiology rec MRI head d/t h/o aneursym which may preclude chronic anticoagulation therapy.  Patient was not on anticoagulation prior to admission.  Was taking aspirin 81 mg daily.   12/29  Heparin level therapeutic on 750 units/hr  CBC: Hgb down from baseline (no bleeding noted), pltc WNL Today, 12/30  0010 HL=0.38, no problems reported, at goal.  Goal of Therapy:  Heparin level 0.3-0.7 units/ml Monitor platelets by anticoagulation protocol: Yes   Plan:   continue heparin drip at 750 units/hr  Daily heparin level and CBC while on heparin infusion.   Dorrene German  02/19/2015 1:24 AM

## 2015-02-19 NOTE — Progress Notes (Signed)
Correct time of deflation is 1910

## 2015-02-19 NOTE — Interval H&P Note (Signed)
Cath Lab Visit (complete for each Cath Lab visit)  Clinical Evaluation Leading to the Procedure:   ACS: Yes.    Non-ACS:    Anginal Classification: CCS IV  Anti-ischemic medical therapy: Minimal Therapy (1 class of medications)  Non-Invasive Test Results: No non-invasive testing performed  Prior CABG: No previous CABG      History and Physical Interval Note:  02/19/2015 4:52 PM  Chelsey Bishop  has presented today for surgery, with the diagnosis of VT  The various methods of treatment have been discussed with the patient and family. After consideration of risks, benefits and other options for treatment, the patient has consented to  Procedure(s): Left Heart Cath and Coronary Angiography (N/A) as a surgical intervention .  The patient's history has been reviewed, patient examined, no change in status, stable for surgery.  I have reviewed the patient's chart and labs.  Questions were answered to the patient's satisfaction.     Chelsey Bishop S.

## 2015-02-19 NOTE — H&P (View-Only) (Signed)
Patient Name: Chelsey Bishop Date of Encounter: 02/19/2015  Principal Problem:   Hypoxia Active Problems:   History of gastric ulcer   COPD (chronic obstructive pulmonary disease) (HCC)   Paroxysmal ventricular tachycardia (HCC)   H/O: CVA (cerebrovascular accident)   Atrial fibrillation ([place])   Ventricular tachycardia, sustained ([place])   PAD (peripheral artery disease) ([place])   Status post femoral-popliteal bypass surgery   V-tach [facility])   Primary Cardiologist: Dr Debara Pickett  Patient Profile: 81 y.o. Female w/ PMH of tobacco abuse, CAD (Mi in 2009, unsure if she required intervention), CVA, HTN, CKD, Type 2 DM, COPD, prior cerebral aneurysm with hemorrhage in 1993, PVD, and HLD, R fem-pop 1995, admitted 02/17/2015 for an endoscopy and developed hypoxia, atrial fibrillation with RVR and later a wide-complex tachycardia concerning for VT (BC did consult 2009 for probable VT and PAF in the setting of sepsis), minimal elevation in cardiac enzymes at that time, normal MV.  SUBJECTIVE: Pt feels well, denies chest pain or SOB. Agreeable to cath, daughter has signed consent. Not on home O2.   OBJECTIVE Filed Vitals:   02/19/15 0300 02/19/15 0400 02/19/15 0500 02/19/15 0600  BP:  112/49  126/42  Pulse: 52 52 50 58  Temp:  97.9 F (36.6 C)    TempSrc:  Oral    Resp: 18 18 27 26   Height:      Weight:      SpO2: 97% 99% 94% 98%    Intake/Output Summary (Last 24 hours) at 02/19/15 0648 Last data filed at 02/19/15 0600  Gross per 24 hour  Intake 1074.09 ml  Output      0 ml  Net 1074.09 ml   Filed Weights   02/17/15 1221 02/17/15 1622 02/17/15 1745  Weight: 93 lb (42.185 kg) 93 lb 14.7 oz (42.6 kg) 94 lb 5.7 oz (42.8 kg)    PHYSICAL EXAM General: Well developed, well nourished, female in no acute distress. Head: Normocephalic, atraumatic.  Neck: Supple without bruits, JVD 8-9 cm. Lungs:  Resp regular and unlabored, rales bases, slight wheeze. Heart: RRR, S1, S2, no S3, S4, or  murmur; no rub. Abdomen: Soft, non-tender, non-distended, BS + x 4.  Extremities: No clubbing, cyanosis, edema.  Neuro: Alert and oriented X 2. Moves all extremities spontaneously. Psych: Normal affect.  LABS: CBC:  Recent Labs  02/18/15 0552 02/19/15 0335  WBC 20.2* 11.6*  HGB 8.8* 9.0*  HCT 26.6* 27.4*  MCV 96.7 97.9  PLT 311 300   INR:  Recent Labs  02/19/15 0335  INR [id number]   Basic Metabolic Panel:  Recent Labs  02/17/15 1407 02/18/15 0552 02/19/15 0335  NA 144 139 140  K 3.8 4.8 4.5  CL 101 100* 100*  CO2 31 27 31   GLUCOSE 223* 248* 110*  BUN 17 14 14   CREATININE 0.56 0.56 0.54  CALCIUM 8.2* 8.2* 8.3*  MG 1.2*  --  1.5*   Liver Function Tests:  Recent Labs  02/17/15 1407 02/18/15 0552  AST 17 21  ALT 12* 10*  ALKPHOS 46 45  BILITOT 0.3 0.1*  PROT 6.6 6.5  ALBUMIN 3.3* 3.1*   Cardiac Enzymes:  Recent Labs  02/17/15 1407 02/17/15 1725 02/17/15 2245 02/18/15 0552  CKTOTAL 30*  --   --   --   CKMB 3.2  --   --   --   TROPONINI 0.07* 0.06* 0.05* 0.05*   Thyroid Function Tests:  Recent Labs  02/19/15 0335  TSH 1.869  TELE: SR, S brady in the 40s overnight, PVCs and PACs; 3 bt run ?NSVT      ECHO 03/14/2023 - Left ventricle: The cavity size was normal. There was mild concentric hypertrophy. Systolic function was vigorous. The estimated ejection fraction was in the range of 65% to 70%. Wall motion was normal; there were no regional wall motion abnormalities. Features are consistent with a pseudonormal left ventricular filling pattern, with concomitant abnormal relaxation and increased filling pressure (grade 2 diastolic dysfunction). Doppler parameters are consistent with elevated ventricular end-diastolic filling pressure. - Aortic valve: Trileaflet; mildly thickened, mildly calcified leaflets. Transvalvular velocity was minimally increased. There was no stenosis. There was no regurgitation. - Aortic root: The  aortic root was normal in size. - Mitral valve: Calcified annulus. Mildly thickened leaflets . - Left atrium: The atrium was mildly dilated. - Right ventricle: Systolic function was normal. - Tricuspid valve: There was moderate regurgitation. - Pulmonic valve: There was no regurgitation. - Pulmonary arteries: Systolic pressure was mildly to moderately increased. PA peak pressure: 45 mm Hg (S). - Inferior vena cava: The vessel was normal in size. - Pericardium, extracardiac: There was no pericardial effusion.   Radiology/Studies: Mr Virgel Paling [id number] Contrast 03-14-2015  CLINICAL DATA:  History of aneurysm. No previous imaging available to less. EXAM: MRA HEAD WITHOUT CONTRAST TECHNIQUE: Angiographic images of the Circle of Willis were obtained using MRA technique without intravenous contrast. COMPARISON:  None. FINDINGS: Both internal carotid arteries are patent through the skullbase and siphon regions. There is moderate atherosclerotic narrowing in the carotid siphon regions, estimated at 40-50% on the right and 20-30% on the left. On the left, the anterior and middle cerebral arteries are patent without proximal stenosis, aneurysm or vascular malformation. On the right, the anterior cerebral artery appears normal. There is no abnormality of the M1 segment which looks like a severe stenosis. There is slight dilatation of the vessels in that region, but I can not diagnosis this as an aneurysm. Beyond that, the vessel appears widely patent. Both vertebral arteries are patent with the right being dominant. No basilar stenosis. Posterior circulation branch vessels are patent. There is some atherosclerotic irregularity of the more distal PCA branch is. IMPRESSION: No definable intracranial aneurysm. There is an abnormality of the M1 segment of the middle cerebral artery on the right most consistent with a severe stenosis. This would place the patient at risk of right MCA infarction. In the region of the  stenosis, the vessel does show mild dilatation, but the appearance is not consistent with a berry aneurysm. Electronically Signed   By: Nelson Chimes M.D.   On: Mar 14, 2015 17:52   Dg Chest Port 1 View 02/17/2015  CLINICAL DATA:  Shortness of breath.  Hypoxia.  Recent endoscopy. EXAM: PORTABLE CHEST 1 VIEW COMPARISON:  Chest x-ray dated 08/17/2007 and CT scan of the chest dated 02/18/2010 FINDINGS: Heart size and pulmonary vascularity are normal. Extensive calcification in the thoracic aorta. There are no infiltrates or effusions. No pneumothorax. The lungs are slightly hyperinflated with flattening of the diaphragm suggesting emphysema. No acute osseous abnormality. IMPRESSION: No acute abnormality.  Probable COPD.  Aortic atherosclerosis. Electronically Signed   By: Lorriane Shire M.D.   On: 02/17/2015 14:23     Current Medications:  . amiodarone  400 mg Oral BID  . antiseptic oral rinse  7 mL Mouth Rinse BID  . [START ON 02/20/2015] aspirin EC  81 mg Oral Daily  . atorvastatin  20 mg Oral q1800  .  feeding supplement (ENSURE ENLIVE)  237 mL Oral BID BM  . insulin aspart  0-9 Units Subcutaneous TID WC  . insulin aspart  3 Units Subcutaneous TID WC  . magnesium sulfate 1 - 4 g bolus IVPB  2 g Intravenous Once  . metoprolol  25 mg Oral BID  . NIFEdipine  60 mg Oral Daily  . pantoprazole  40 mg Oral Daily   . heparin 750 Units/hr (02/18/15 1800)    ASSESSMENT AND PLAN: 1. New Onset Atrial Fibrillation - converted to NSR overnight. - This patients CHA2DS2-VASc Score and unadjusted Ischemic Stroke Rate (% per year) is equal to 10.8 % stroke rate/year from a score of 8 (HTN, DM, Vascular, Female, Age (2), Stroke (2)). Currently on Heparin. Will need to be on long-term anticoagulation. Has a colonic mass which was suppose to be excised yesterday. This will need to be addressed prior to starting long-term anticoagulation. - IV Amiodarone>>400mg  BID PO on 12/29 - Metoprolol 50mg  BID>>25 mg bid  12/29 2nd bradycardia.  - got 25 mg metoprolol 12/29 pm, HR 40s overnight, pt asymptomatic - may need to reduce dose further  2. Sustained Ventricular Tachycardia - cyclic troponin values have been 0.07, 0.06, and 0.05. - echocardiogram w/ normal EF -  LHC planned with sustained VT and complex medical history.  - continue statin, BB, and Heparin. No ASA at this time due to gastric mass.  Principal Problem: 3. Hypoxia - per IM - Will have to be careful with sedation as she became hypoxic when sedated for the EGD - slight wheeze today, discuss nebs with MI - was not on home O2 PTA  4. PAD - S/P R fem-pop 1995 or 1999 - radial access preferred - last note from Dr Donnetta Hutching is 2010 - 2010 DUPLEX: Doppler arterial waveforms are biphasic proximal to, within and monophasic distal to the right fem-pop bypass graft with an elevated peak systolic velocity of 123456 cm/second in the right external iliac artery. IMPRESSION: 1. ABIs are stable from previous study bilaterally. 2. Patent right fem-pop bypass graft. 3. 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  Otherwise, per IM Active Problems:   History of gastric ulcer   COPD (chronic obstructive pulmonary disease) (HCC)   Paroxysmal ventricular tachycardia (HCC)   H/O: CVA (cerebrovascular accident)   Atrial fibrillation (HCC)   Ventricular tachycardia, sustained ([place])   PAD (peripheral artery disease) ([place])   Status post femoral-popliteal bypass surgery   V-tach [facility])   Signed, Rosaria Ferries , PA-C 6:48 AM 02/19/2015

## 2015-02-19 NOTE — Progress Notes (Signed)
Daughter in to see

## 2015-02-19 NOTE — Progress Notes (Signed)
Progress Note   Subjective  Patient reports feeling better today. She has been tolerating a diet. Echocardiogram looked okay, she has a cardiac cath pending this afternoon. Pathology results from gastric biopsies show only intestinal metaplasia and no dysplasia or malignancy.    Objective   Vital signs in last 24 hours: Temp:  [97.5 F (36.4 C)-98.2 F (36.8 C)] 98.2 F (36.8 C) (12/30 0800) Pulse Rate:  [30-78] 55 (12/30 0800) Resp:  [17-27] 24 (12/30 0800) BP: (99-160)/(36-75) 132/46 mmHg (12/30 0800) SpO2:  [91 %-100 %] 95 % (12/30 0800) Weight:  [94 lb (42.638 kg)] 94 lb (42.638 kg) (12/29 1728) Last BM Date: 02/17/15 General:    white female in NAD Heart:  Irregularly irregular rate and rhythm; no murmurs Lungs: Respirations even and unlabored, lungs CTA bilaterally Abdomen:  Soft, nontender and nondistended. Normal bowel sounds. Extremities:  Without edema. Neurologic:  Alert and oriented,  grossly normal neurologically. Psych:  Cooperative. Normal mood and affect.  Intake/Output from previous day: 12/29 0701 - 12/30 0700 In: 1040.8 [P.O.:600; I.V.:215.8] Out: -  Intake/Output this shift: Total I/O In: 65 [I.V.:15; IV Piggyback:50] Out: 50 [Urine:50]  Lab Results:  Recent Labs  02/17/15 1407 02/18/15 0552 02/19/15 0335  WBC 15.1* 20.2* 11.6*  HGB 9.6* 8.8* 9.0*  HCT 29.5* 26.6* 27.4*  PLT 312 311 300   BMET  Recent Labs  02/17/15 1407 02/18/15 0552 02/19/15 0335  NA 144 139 140  K 3.8 4.8 4.5  CL 101 100* 100*  CO2 31 27 31   GLUCOSE 223* 248* 110*  BUN 17 14 14   CREATININE 0.56 0.56 0.54  CALCIUM 8.2* 8.2* 8.3*   LFT  Recent Labs  02/18/15 0552  PROT 6.5  ALBUMIN 3.1*  AST 21  ALT 10*  ALKPHOS 45  BILITOT 0.1*   PT/INR  Recent Labs  02/17/15 1847 02/19/15 0335  LABPROT 14.6 14.6  INR 1.12 1.12    Studies/Results: Mr Jodene Nam Head Wo Contrast  02/18/2015  CLINICAL DATA:  History of aneurysm. No previous imaging available  to less. EXAM: MRA HEAD WITHOUT CONTRAST TECHNIQUE: Angiographic images of the Circle of Willis were obtained using MRA technique without intravenous contrast. COMPARISON:  None. FINDINGS: Both internal carotid arteries are patent through the skullbase and siphon regions. There is moderate atherosclerotic narrowing in the carotid siphon regions, estimated at 40-50% on the right and 20-30% on the left. On the left, the anterior and middle cerebral arteries are patent without proximal stenosis, aneurysm or vascular malformation. On the right, the anterior cerebral artery appears normal. There is no abnormality of the M1 segment which looks like a severe stenosis. There is slight dilatation of the vessels in that region, but I can not diagnosis this as an aneurysm. Beyond that, the vessel appears widely patent. Both vertebral arteries are patent with the right being dominant. No basilar stenosis. Posterior circulation branch vessels are patent. There is some atherosclerotic irregularity of the more distal PCA branch is. IMPRESSION: No definable intracranial aneurysm. There is an abnormality of the M1 segment of the middle cerebral artery on the right most consistent with a severe stenosis. This would place the patient at risk of right MCA infarction. In the region of the stenosis, the vessel does show mild dilatation, but the appearance is not consistent with a berry aneurysm. Electronically Signed   By: Nelson Chimes M.D.   On: 02/18/2015 17:52   Dg Chest Port 1 View  02/17/2015  CLINICAL DATA:  Shortness of breath.  Hypoxia.  Recent endoscopy. EXAM: PORTABLE CHEST 1 VIEW COMPARISON:  Chest x-ray dated 08/17/2007 and CT scan of the chest dated 02/18/2010 FINDINGS: Heart size and pulmonary vascularity are normal. Extensive calcification in the thoracic aorta. There are no infiltrates or effusions. No pneumothorax. The lungs are slightly hyperinflated with flattening of the diaphragm suggesting emphysema. No acute  osseous abnormality. IMPRESSION: No acute abnormality.  Probable COPD.  Aortic atherosclerosis. Electronically Signed   By: Lorriane Shire M.D.   On: 02/17/2015 14:23       Assessment / Plan:   81 y/o female with multiple-comorbidities including COPD, PVD, history of CVA and MI, who underwent EGD 2 days prior to follow up a history of a gastric ulcer with associated low grade dysplasia. Unfortunately she had hypoxia during the case which initially resolved, however following the EGD she went into atrial fibrillation and then wide complex tachycardia concerning for ventricular tachycardia, which aborted spontaneously. She has been admitted for further cardiac workup.   Regarding her EGD results, she had a healed ulcer in the antrum with some nodular tissue in the area but no significant mass lesion was noted. Biopsies obtained and shows only intestinal metaplasia, no dysplasia or malignancy, and I reassured the patient and her family in this light. Her colonoscopy was cancelled and was not done, but was to remove a large cecal polyp which is an adenoma, not cancerous based on prior biopsies. Neither of these lesions are high risk for bleeding and if she needs anticoagulation or regular aspirin, it is okay from my perspective and keep her on a PPI for prophylaxis. She has a chronic anemia of which the etiology is not clear at this time. It was previously attributed to her gastric ulcer but this has since healed and her anemia persists. Her small bowel appears okay on prior CT scan without mass lesion, moving forward she may warrant a capsule study to clear the small bowel but would hold off for now if she has not had any overt bleeding. From a GI bleeding perspective, I would proceed with anticoagulation if you deem it appropriate from a cardiovascular standpoint, and monitor her Hgb for any evidence of active bleeding. Hgb is stable at this time.  Please let me know if you have any questions moving forward,  I appreciate internal medicine / cardiology assistance in this case.    Cellar, MD [facility] Pager [phone]   Principal Problem:   Hypoxia Active Problems:   History of gastric ulcer   COPD (chronic obstructive pulmonary disease) ([place])   Paroxysmal ventricular tachycardia (HCC)   H/O: CVA (cerebrovascular accident)   Atrial fibrillation ([place])   Ventricular tachycardia, sustained ([place])   PAD (peripheral artery disease) ([place])   Status post femoral-popliteal bypass surgery   V-tach ([place])     LOS: 2 days   Chelsey Bishop Chelsey Bishop  02/19/2015, 9:41 AM

## 2015-02-19 NOTE — Progress Notes (Signed)
Pt received from [facility] via [place] alert and oriented X4. Pt denies any discomfort at this time.  Family at bedside. Pt on monitor and IVF infusing in the left forearm.   Pt waiting for cath procedure.  Call bell in reach and siderails up.

## 2015-02-20 DIAGNOSIS — R7989 Other specified abnormal findings of blood chemistry: Secondary | ICD-10-CM

## 2015-02-20 DIAGNOSIS — I10 Essential (primary) hypertension: Secondary | ICD-10-CM

## 2015-02-20 DIAGNOSIS — Z8719 Personal history of other diseases of the digestive system: Secondary | ICD-10-CM

## 2015-02-20 DIAGNOSIS — I2510 Atherosclerotic heart disease of native coronary artery without angina pectoris: Secondary | ICD-10-CM

## 2015-02-20 LAB — BASIC METABOLIC PANEL WITH GFR
Anion gap: 8 (ref 5–15)
BUN: 10 mg/dL (ref 6–20)
CO2: 31 mmol/L (ref 22–32)
Calcium: 8.3 mg/dL — ABNORMAL LOW (ref 8.9–10.3)
Chloride: 98 mmol/L — ABNORMAL LOW (ref 101–111)
Creatinine, Ser: 0.5 mg/dL (ref 0.44–1.00)
GFR calc Af Amer: >60 mL/min (ref >60)
GFR calc non Af Amer: >60 mL/min (ref >60)
Glucose, Bld: 297 mg/dL — ABNORMAL HIGH (ref 65–99)
Potassium: 3.9 mmol/L (ref 3.5–5.1)
Sodium: 137 mmol/L (ref 135–145)

## 2015-02-20 LAB — CBC
HCT: 26.7 % — ABNORMAL LOW (ref 36.0–46.0)
Hemoglobin: 8.9 g/dL — ABNORMAL LOW (ref 12.0–15.0)
MCH: 32.5 pg (ref 26.0–34.0)
MCHC: 33.3 g/dL (ref 30.0–36.0)
MCV: 97.4 fL (ref 78.0–100.0)
Platelets: 299 K/uL (ref 150–400)
RBC: 2.74 MIL/uL — ABNORMAL LOW (ref 3.87–5.11)
RDW: 17.3 % — ABNORMAL HIGH (ref 11.5–15.5)
WBC: 8.2 K/uL (ref 4.0–10.5)

## 2015-02-20 LAB — BASIC METABOLIC PANEL
Anion gap: 10 (ref 5–15)
BUN: 10 mg/dL (ref 6–20)
CO2: 29 mmol/L (ref 22–32)
CREATININE: 0.61 mg/dL (ref 0.44–1.00)
Calcium: 8.7 mg/dL — ABNORMAL LOW (ref 8.9–10.3)
Chloride: 100 mmol/L — ABNORMAL LOW (ref 101–111)
GFR calc Af Amer: >60 mL/min (ref >60)
GLUCOSE: 121 mg/dL — AB (ref 65–99)
Potassium: 3.3 mmol/L — ABNORMAL LOW (ref 3.5–5.1)
SODIUM: 139 mmol/L (ref 135–145)

## 2015-02-20 LAB — GLUCOSE, CAPILLARY
GLUCOSE-CAPILLARY: 439 mg/dL — AB (ref 65–99)
GLUCOSE-CAPILLARY: 35 mg/dL — AB (ref 65–99)
GLUCOSE-CAPILLARY: 120 mg/dL — AB (ref 65–99)
Glucose-Capillary: 271 mg/dL — ABNORMAL HIGH (ref 65–99)
Glucose-Capillary: 269 mg/dL — ABNORMAL HIGH (ref 65–99)
Glucose-Capillary: 419 mg/dL — ABNORMAL HIGH (ref 65–99)
Glucose-Capillary: 196 mg/dL — ABNORMAL HIGH (ref 65–99)
Glucose-Capillary: 195 mg/dL — ABNORMAL HIGH (ref 65–99)
Glucose-Capillary: 36 mg/dL — CL (ref 65–99)
Glucose-Capillary: 214 mg/dL — ABNORMAL HIGH (ref 65–99)

## 2015-02-20 LAB — MAGNESIUM: MAGNESIUM: 1.3 mg/dL — AB (ref 1.7–2.4)

## 2015-02-20 MED ORDER — INSULIN GLARGINE 100 UNIT/ML ~~LOC~~ SOLN
3.0000 [IU] | Freq: Every day | SUBCUTANEOUS | Status: DC
  Filled 2015-02-20: qty 0.03

## 2015-02-20 MED ORDER — INSULIN ASPART 100 UNIT/ML ~~LOC~~ SOLN: 0.0000 [IU] | Freq: Three times a day (TID) | SUBCUTANEOUS | Status: DC

## 2015-02-20 MED ORDER — METFORMIN HCL 500 MG PO TABS
1500.0000 mg | ORAL_TABLET | Freq: Every day | ORAL | Status: DC
  Administered 2015-02-21: 1500 mg via ORAL
  Filled 2015-02-20 (×2): qty 3

## 2015-02-20 MED ORDER — INSULIN ASPART 100 UNIT/ML ~~LOC~~ SOLN: 3.0000 [IU] | Freq: Three times a day (TID) | SUBCUTANEOUS | Status: DC

## 2015-02-20 MED ORDER — ALPRAZOLAM 0.25 MG PO TABS
0.2500 mg | ORAL_TABLET | Freq: Once | ORAL | Status: AC
  Administered 2015-02-20: 0.25 mg via ORAL
  Filled 2015-02-20: qty 1

## 2015-02-20 MED ORDER — POTASSIUM CHLORIDE CRYS ER 20 MEQ PO TBCR
20.0000 meq | EXTENDED_RELEASE_TABLET | Freq: Every day | ORAL | Status: DC
  Administered 2015-02-20 – 2015-02-21 (×2): 20 meq via ORAL
  Filled 2015-02-20 (×2): qty 1

## 2015-02-20 MED ORDER — METFORMIN HCL 500 MG PO TABS
1000.0000 mg | ORAL_TABLET | Freq: Every day | ORAL | Status: DC
  Administered 2015-02-20: 1000 mg via ORAL
  Filled 2015-02-20 (×2): qty 2

## 2015-02-20 MED ORDER — INSULIN ASPART 100 UNIT/ML ~~LOC~~ SOLN
10.0000 [IU] | Freq: Once | SUBCUTANEOUS | Status: AC
  Administered 2015-02-20: 10 [IU] via SUBCUTANEOUS

## 2015-02-20 MED ORDER — DEXTROSE 50 % IV SOLN
INTRAVENOUS | Status: AC
  Administered 2015-02-20: 50 mL
  Filled 2015-02-20: qty 50

## 2015-02-20 MED ORDER — INSULIN GLARGINE 100 UNIT/ML ~~LOC~~ SOLN
7.0000 [IU] | Freq: Every day | SUBCUTANEOUS | Status: DC
  Filled 2015-02-20: qty 0.07

## 2015-02-20 MED ORDER — MAGNESIUM OXIDE 400 (241.3 MG) MG PO TABS
400.0000 mg | ORAL_TABLET | Freq: Two times a day (BID) | ORAL | Status: DC
  Administered 2015-02-20 – 2015-02-21 (×2): 400 mg via ORAL
  Filled 2015-02-20 (×3): qty 1

## 2015-02-20 MED ORDER — LISINOPRIL 10 MG PO TABS
10.0000 mg | ORAL_TABLET | Freq: Every day | ORAL | Status: DC
  Administered 2015-02-20 – 2015-02-21 (×2): 10 mg via ORAL
  Filled 2015-02-20 (×2): qty 1

## 2015-02-20 NOTE — Progress Notes (Signed)
Progress Note   Subjective  Patient underwent cardiac cath yesterday without intervention performed. She denies abdominal pains. She is passing brown stool, no evidence of GI bleeding.    Objective   Vital signs in last 24 hours: Temp:  [96.5 F (35.8 C)-98.6 F (37 C)] 98.4 F (36.9 C) (12/31 1200) Pulse Rate:  [36-123] 55 (12/31 1040) Resp:  [18-27] 25 (12/31 1040) BP: (119-175)/(45-90) 135/54 mmHg (12/31 1040) SpO2:  [0 %-100 %] 99 % (12/31 1040) Weight:  [99 lb 6.8 oz (45.1 kg)] 99 lb 6.8 oz (45.1 kg) (12/31 0356) Last BM Date: 02/17/15 General:    white female in NAD Heart:  Regular rate and rhythm; no murmurs Lungs: Respirations even and unlabored, lungs CTA bilaterally Abdomen:  Soft, nontender and nondistended. Normal bowel sounds. Extremities:  Without edema. Neurologic:  Alert and oriented,  grossly normal neurologically. Psych:  Cooperative. Normal mood and affect.  Intake/Output from previous day: 12/30 0701 - 12/31 0700 In: 495.1 [P.O.:100; I.V.:345.1; IV Piggyback:50] Out: 150 [Urine:150] Intake/Output this shift: Total I/O In: 492.5 [P.O.:480; I.V.:12.5] Out: 125 [Urine:125]  Lab Results:  Recent Labs  02/18/15 0552 02/19/15 0335 02/20/15 0644  WBC 20.2* 11.6* 8.2  HGB 8.8* 9.0* 8.9*  HCT 26.6* 27.4* 26.7*  PLT 311 300 299   BMET  Recent Labs  02/18/15 0552 02/19/15 0335 02/20/15 0644  NA 139 140 137  K 4.8 4.5 3.9  CL 100* 100* 98*  CO2 27 31 31   GLUCOSE 248* 110* 297*  BUN 14 14 10   CREATININE 0.56 0.54 0.50  CALCIUM 8.2* 8.3* 8.3*   LFT  Recent Labs  02/18/15 0552  PROT 6.5  ALBUMIN 3.1*  AST 21  ALT 10*  ALKPHOS 45  BILITOT 0.1*   PT/INR  Recent Labs  02/17/15 1847 02/19/15 0335  LABPROT 14.6 14.6  INR 1.12 1.12    Studies/Results: Mr Jodene Nam Head Wo Contrast  02/18/2015  CLINICAL DATA:  History of aneurysm. No previous imaging available to less. EXAM: MRA HEAD WITHOUT CONTRAST TECHNIQUE: Angiographic  images of the Circle of Willis were obtained using MRA technique without intravenous contrast. COMPARISON:  None. FINDINGS: Both internal carotid arteries are patent through the skullbase and siphon regions. There is moderate atherosclerotic narrowing in the carotid siphon regions, estimated at 40-50% on the right and 20-30% on the left. On the left, the anterior and middle cerebral arteries are patent without proximal stenosis, aneurysm or vascular malformation. On the right, the anterior cerebral artery appears normal. There is no abnormality of the M1 segment which looks like a severe stenosis. There is slight dilatation of the vessels in that region, but I can not diagnosis this as an aneurysm. Beyond that, the vessel appears widely patent. Both vertebral arteries are patent with the right being dominant. No basilar stenosis. Posterior circulation branch vessels are patent. There is some atherosclerotic irregularity of the more distal PCA branch is. IMPRESSION: No definable intracranial aneurysm. There is an abnormality of the M1 segment of the middle cerebral artery on the right most consistent with a severe stenosis. This would place the patient at risk of right MCA infarction. In the region of the stenosis, the vessel does show mild dilatation, but the appearance is not consistent with a berry aneurysm. Electronically Signed   By: Nelson Chimes M.D.   On: 02/18/2015 17:52       Assessment / Plan:   81 y/o female with multiple-comorbidities including COPD, PVD, history of CVA  and MI, who underwent EGD 12/28 days prior to follow up a history of a gastric ulcer with associated low grade dysplasia. Unfortunately she became hypoxic during the case, and following the EGD she went into atrial fibrillation and then wide complex tachycardia concerning for ventricular tachycardia. She was admitted for further cardiac workup.   Regarding her EGD results, she had a healed ulcer in the antrum with some nodular  tissue in the area but no significant mass lesion was noted. Biopsies obtained and shows only intestinal metaplasia, no dysplasia or malignancy, and I reassured the patient and her family in this light. Her colonoscopy was cancelled and was not done, but was to remove a large cecal polyp which is an adenoma, not cancerous based on prior biopsies. Neither of these lesions are high risk for bleeding.  In regards to her anemia, it is normocytic and unclear what is driving the process. I would recommend iron studies and other lab workup for her anemia at this time. We discussed possible capsule study to clear her small bowel however she is not currently a good surgical candidate if she were to have a retained capsule, and thus would hold off at this time. Her prior FOBT could have been due to her prior gastric ulcer which has intervaly healed. Moving forward we can check stool for OB and if persistently negative it would seem unlikely that her anemia is from GI tract loss. Otherwise, we have no plans for polypectomy of cecal polyp at this time, we will address this as an outpatient in the upcoming few months once her cardiac issues have been stable. I discussed this issue with the patient and daughter and don't think this is causing any active issues now, but has potential to turn malignant over time.   Will plan on anemia labs tomorrow, no further GI studies planned otherwise while inpatient  Please let me know if you have any questions moving forward.  [place] Cellar, MD [facility] Pager [phone]  Principal Problem:   Hypoxia Active Problems:   History of gastric ulcer   COPD (chronic obstructive pulmonary disease) ([place])   Paroxysmal ventricular tachycardia (HCC)   H/O: CVA (cerebrovascular accident)   Atrial fibrillation ([place])   Ventricular tachycardia, sustained ([place])   PAD (peripheral artery disease) ([place])   Status post femoral-popliteal bypass surgery   V-tach ([place])    History of aneurysm     LOS: 3 days   [place]  02/20/2015, 3:22 PM

## 2015-02-20 NOTE — Progress Notes (Signed)
Hypoglycemic Event  CBG: 35  Treatment: D50 IV 50 mL  Symptoms: Sweaty  Follow-up CBG: Time: 1650 CBG Result: 195  Possible Reasons for Event: Medication regimen: modified by Dr. Verlon Au at request per family  Comments/MD notified: Dr. Verlon Au notified  Rhina Brackett

## 2015-02-20 NOTE — Progress Notes (Signed)
Chelsey Bishop [id number] DOB: 1930-01-09 DOA: 02/17/2015 PCP: Criselda Peaches, MD  Brief narrative:  81 y/o ? COPD HTn Remote R sided CVA noted 2009 PVD c R Fem-POP Bypass 1995 Brain Aneurysm 1993 DM ty II Prior CDiff colitis ?Prior DVT Pancreatic Pseudocyst 2009 Lingular nodule on CT chest Colonoscopy  EGD 3x20 mm ulcer 9.9.16-low grade dysplasia  Admitted from the endoscopy suite with hypoxia needed to be reintubated and found to be in A. fib 5 PM on 12/28 found to have sustained V. tach versus SVT with aberrancy Cardiology consulted Cath planned for 12/30 pm given decreased EF    Past medical history-As per Problem list Chart reviewed as below-   Consultants:  Cardiology  Procedures:  Echo  Antibiotics:  none   Subjective   Alert pleasant  Doing fair. Sitting up in the bed and  about eat breakfast No dark stool no tarry stool no nausea no vomiting No chest pain No blurred vision no double vision   Objective    Interim History: None  Telemetry: Some brady in 50's    Objective: Filed Vitals:   02/20/15 0800 02/20/15 0851 02/20/15 1040 02/20/15 1200  BP: 175/75  135/54   Pulse: 70  55   Temp: 97.4 F (36.3 C)   98.4 F (36.9 C)  TempSrc: Oral   Oral  Resp: 22  25   Height:      Weight:      SpO2: 100% 98% 99%     Intake/Output Summary (Last 24 hours) at 02/20/15 1539 Last data filed at 02/20/15 1149  Gross per 24 hour  Intake 525.63 ml  Output    225 ml  Net 300.63 ml    Exam:  General: EOMI frail, bitemporalis wasting supraclavicular wasting Cardiovascular: S1-S2 nsr. No bruit Respiratory: Clinically clear no rales no rhonchi Abdomen: Soft nontender nondistended no rebound Skin no lower extremity edema  Neuro intact  Data Reviewed: Basic Metabolic Panel:  Recent Labs Lab 02/17/15 1407 02/18/15 0552 02/19/15 0335 02/20/15 0644  NA 144 139 140 137  K 3.8 4.8 4.5 3.9  CL 101 100* 100* 98*  CO2 31 27 31 31     GLUCOSE 223* 248* 110* 297*  BUN 17 14 14 10   CREATININE 0.56 0.56 0.54 0.50  CALCIUM 8.2* 8.2* 8.3* 8.3*  MG 1.2*  --  1.5*  --    Liver Function Tests:  Recent Labs Lab 02/17/15 1407 02/18/15 0552  AST 17 21  ALT 12* 10*  ALKPHOS 46 45  BILITOT 0.3 0.1*  PROT 6.6 6.5  ALBUMIN 3.3* 3.1*   No results for input(s): LIPASE, AMYLASE in the last 168 hours. No results for input(s): AMMONIA in the last 168 hours. CBC:  Recent Labs Lab 02/17/15 1407 02/18/15 0552 02/19/15 0335 02/20/15 0644  WBC 15.1* 20.2* 11.6* 8.2  HGB 9.6* 8.8* 9.0* 8.9*  HCT 29.5* 26.6* 27.4* 26.7*  MCV 96.7 96.7 97.9 97.4  PLT 312 311 300 299   Cardiac Enzymes:  Recent Labs Lab 02/17/15 1407 02/17/15 1725 02/17/15 2245 02/18/15 0552  CKTOTAL 30*  --   --   --   CKMB 3.2  --   --   --   TROPONINI 0.07* 0.06* 0.05* 0.05*   BNP: Invalid input(s): POCBNP CBG:  Recent Labs Lab 02/20/15 0651 02/20/15 0744 02/20/15 1139 02/20/15 1337 02/20/15 1517  GLUCAP 271* 269* 439* 419* 196*    Recent Results (from the past 240 hour(s))  MRSA PCR Screening  Status: None   Collection Time: 02/17/15  5:52 PM  Result Value Ref Range Status   MRSA by PCR NEGATIVE NEGATIVE Final    Comment:        The GeneXpert MRSA Assay (FDA approved for NASAL specimens only), is one component of a comprehensive MRSA colonization surveillance program. It is not intended to diagnose MRSA infection nor to guide or monitor treatment for MRSA infections.      Studies:              All Imaging reviewed and is as per above notation   Scheduled Meds: . amiodarone  400 mg Oral BID  . antiseptic oral rinse  7 mL Mouth Rinse BID  . aspirin EC  81 mg Oral Daily  . atorvastatin  20 mg Oral q1800  . feeding supplement (ENSURE ENLIVE)  237 mL Oral BID BM  . insulin aspart  0-9 Units Subcutaneous TID WC  . lisinopril  10 mg Oral Daily  . metoprolol  25 mg Oral BID  . NIFEdipine  60 mg Oral Daily  .  pantoprazole  40 mg Oral Daily  . sodium chloride  3 mL Intravenous Q12H   Continuous Infusions:     Assessment/Plan:  1. NSVT,  V. tach-management per cardiology [place] 2 score 8,  with amiodarone 30 mg/h-->Amiodarone 400 bid. Cards ?metoprolol 50 twice-->25 bid 12/30. Anticoagulation  okay per GI. Echo EF=65-70% Grade 2 dd Appreciate cardiology input.  Keep magnesium above 2, potassium > 4.  We will hold off on anticoagulation at present time as per cardiology despite elevated [place] score 2. Transient hypoxia-? Underlying COPD, chest x-ray 12/28 benign--no wheeze no rales no rhonchi therefore hold steroids for now. As no cough would also hold antibiotics.  desat screen today  3. History of gastric ulcer and colon polyp-as per GI. Hold Asa  for now. Continue pantoprazole 40 daily 4. Diabetes mellitus type II-metformin 1.5 g a.m., 1 g p.m. on hold, Amaryl 2 mg on hold-low blood sugar 12/30 however started to have elevated blood sugars in the 400 range 12/31. Will add Lantus 7 units and place mealtime coverage sensitive scale in addition to sliding scale coverage and uptitrated 5. Hyperlipidemia-ho-continue atorvastatin 10 mg daily as substitution for simvastatin  6. Remote history of right-sided CVA 2009-aspirin as above 7. Hypertension-continue nifedipine 60 daily in addition to above meds. Lisinopril restarted at 10 mg daily 8. Brain aneurysm in 1993-currently stable and he MRI was performed 12/29 and at the M1 segment showed severe stenosis.   9. History of pancreatic pseudocyst-stable    Transfer tele   likely home 24-48 hours   Verneita Griffes, MD  [facility] Pager [phone] 02/20/2015, 3:39 PM    LOS: 3 days

## 2015-02-20 NOTE — Progress Notes (Addendum)
Hypoglycemic Event  CBG: 36  Treatment: D50 IV 50 mL  Symptoms: Sweaty and Shaky  Follow-up CBG: [id number] CBG Result:[id number]  Possible Reasons for Event: Medication regimen: pt was taking ss insulin with heart healthy diet-recent change to metformin and carb modified diet  Comments/MD notified:will continue to monitor    Chelsey Bishop, Coralee Pesa

## 2015-02-20 NOTE — Progress Notes (Addendum)
Dr. Verlon Au notified about 21 second run of V.tach. Pt light headed and short of breath. Vital signs stable after event. Labs ordered and drawn. Magnesium and Potassium to be replaced. Cardiology office called and on-call Dr. Claiborne Billings aware.

## 2015-02-20 NOTE — Progress Notes (Signed)
Primary cardiologist: Dr. Lyman Bishop  Seen for followup: PAF/VT in setting of hypoxia, elevated troponin I, CAD  Subjective:    Patient in bedside chair, daughter Tammy in the room. She is hungry, no chest or abdominal pain. No palpitations. She is eager to go home.  Objective:   Temp:  [96.5 F (35.8 C)-98.6 F (37 C)] 98.6 F (37 C) (12/31 0356) Pulse Rate:  [36-123] 62 (12/31 0400) Resp:  [18-27] 22 (12/31 0400) BP: (119-153)/(45-90) 144/52 mmHg (12/31 0400) SpO2:  [0 %-100 %] 99 % (12/31 0400) Weight:  [99 lb 6.8 oz (45.1 kg)] 99 lb 6.8 oz (45.1 kg) (12/31 0356) Last BM Date: 02/17/15  Filed Weights   02/17/15 1622 02/17/15 1745 02/20/15 0356  Weight: 93 lb 14.7 oz (42.6 kg) 94 lb 5.7 oz (42.8 kg) 99 lb 6.8 oz (45.1 kg)    Intake/Output Summary (Last 24 hours) at 02/20/15 0803 Last data filed at 02/20/15 0700  Gross per 24 hour  Intake 480.05 ml  Output    150 ml  Net 330.05 ml    Telemetry: Sinus rhythm with occasional PVCs and brief bursts of NSVT.  Exam:  General: Frail elderly woman in no distress.  HEENT: Sclera clear, lids normal, oropharynx clear.  Lungs: Diminished breath sounds, no wheezing.  Cardiac: RRR without gallop.  Abdomen: NABS. Nontender.  Extremities: No pitting edema.  Lab Results:  Basic Metabolic Panel:  Recent Labs Lab 02/17/15 1407 02/18/15 0552 02/19/15 0335 02/20/15 0644  NA 144 139 140 137  K 3.8 4.8 4.5 3.9  CL 101 100* 100* 98*  CO2 31 27 31 31   GLUCOSE 223* 248* 110* 297*  BUN 17 14 14 10   CREATININE 0.56 0.56 0.54 0.50  CALCIUM 8.2* 8.2* 8.3* 8.3*  MG 1.2*  --  1.5*  --     Liver Function Tests:  Recent Labs Lab 02/17/15 1407 02/18/15 0552  AST 17 21  ALT 12* 10*  ALKPHOS 46 45  BILITOT 0.3 0.1*  PROT 6.6 6.5  ALBUMIN 3.3* 3.1*    CBC:  Recent Labs Lab 02/18/15 0552 02/19/15 0335 02/20/15 0644  WBC 20.2* 11.6* 8.2  HGB 8.8* 9.0* 8.9*  HCT 26.6* 27.4* 26.7*  MCV 96.7 97.9 97.4    PLT 311 300 299    Cardiac Enzymes:  Recent Labs Lab 02/17/15 1407 02/17/15 1725 02/17/15 2245 02/18/15 0552  CKTOTAL 30*  --   --   --   CKMB 3.2  --   --   --   TROPONINI 0.07* 0.06* 0.05* 0.05*    Coagulation:  Recent Labs Lab 02/17/15 1847 02/19/15 0335  INR 1.12 1.12    Cardiac catheterization 02/19/2015: Dominance: Right   Left Anterior Descending   . Mid LAD lesion, 25% stenosed.     Left Circumflex  There is mild the vessel.   . First Obtuse Marginal Branch   The vessel is small in size.   Marland Kitchen Second Obtuse Marginal Branch   There is mild disease in the vessel.     Right Coronary Artery   . Mid RCA lesion, 20% stenosed. Calcified.   . Third Right Posterolateral   The vessel is small in size.   . 3rd RPLB lesion, 80% stenosed. Calcified.      Echocardiogram 02/18/2015: Study Conclusions  - Left ventricle: The cavity size was normal. There was mild concentric hypertrophy. Systolic function was vigorous. The estimated ejection fraction was in the range of 65% to 70%. Wall  motion was normal; there were no regional wall motion abnormalities. Features are consistent with a pseudonormal left ventricular filling pattern, with concomitant abnormal relaxation and increased filling pressure (grade 2 diastolic dysfunction). Doppler parameters are consistent with elevated ventricular end-diastolic filling pressure. - Aortic valve: Trileaflet; mildly thickened, mildly calcified leaflets. Transvalvular velocity was minimally increased. There was no stenosis. There was no regurgitation. - Aortic root: The aortic root was normal in size. - Mitral valve: Calcified annulus. Mildly thickened leaflets . - Left atrium: The atrium was mildly dilated. - Right ventricle: Systolic function was normal. - Tricuspid valve: There was moderate regurgitation. - Pulmonic valve: There was no regurgitation. - Pulmonary arteries: Systolic pressure was  mildly to moderately increased. PA peak pressure: 45 mm Hg (S). - Inferior vena cava: The vessel was normal in size. - Pericardium, extracardiac: There was no pericardial effusion.   Medications:   Scheduled Medications: . amiodarone  400 mg Oral BID  . antiseptic oral rinse  7 mL Mouth Rinse BID  . aspirin EC  81 mg Oral Daily  . atorvastatin  20 mg Oral q1800  . feeding supplement (ENSURE ENLIVE)  237 mL Oral BID BM  . insulin aspart  0-9 Units Subcutaneous TID WC  . metoprolol  25 mg Oral BID  . NIFEdipine  60 mg Oral Daily  . pantoprazole  40 mg Oral Daily  . sodium chloride  3 mL Intravenous Q12H     Infusions: . heparin 750 Units/hr (02/20/15 0700)     PRN Medications:  sodium chloride, acetaminophen **OR** acetaminophen, acetaminophen, levalbuterol, ondansetron **OR** ondansetron (ZOFRAN) IV, ondansetron (ZOFRAN) IV, sodium chloride   Assessment:   1. PAF and transient VT in setting of hypoxia. Currently in sinus rhythm on Amiodarone and Lopressor. LVEF is normal range.  2. Minor increase in troponin I, peak 0.07 and in relatively flat pattern. Likely secondary to myocardial strain rather than true ACS.  3. CAD statis post cardiac catheterization 12/30 showing no major obstructive disease in the large epicardials with branch vessel disease (80% small 3rd PLB of RCA). Medical therapy is planned.  4. Recent GI workup with EGD showing healed ulcer in the antrum with some nodular tissue in the area but no significant mass lesion. Biopsies obtained and shows only intestinal metaplasia, no dysplasia or malignancy. She also has a large cecal polyp which is an adenoma, not cancerous based on prior biopsies. It looks like GI is holding off on colonoscopy now.  5. Known PAD status post right fem-pop in the 1990s.  6. Short term memory deficits, known to daughter.  7. History of cerebral aneurysm with ICB 1993.   Plan/Discussion:    I reviewed the hospital course and  discussed the situation with the patient and her daughter. Would stop Heparin GTT. Do not plan to start oral anticoagulation at this time in light of comorbidities and complex history as outlined above, particularly until clinical course in better stabilized including direction of anemia. Now on ASA, Lopressor, oral Amiodarone load (continue current dose for now), Procardia XL, and Lipitor. Add back low dose Lisinopril (was on at home and BP going up). Can likely transfer to telemetry and increase activity. If no other GI testing planned now, could anticipate D/C next 24 hours if stable from cardiac perspective. She will need a close followup visit (1 week) with Dr. Debara Pickett or APP to reassess.   Satira Sark, M.D., F.A.C.C.

## 2015-02-21 ENCOUNTER — Encounter (HOSPITAL_COMMUNITY): Payer: Self-pay | Admitting: Nurse Practitioner

## 2015-02-21 LAB — CBC
HEMATOCRIT: 25.5 % — AB (ref 36.0–46.0)
HEMOGLOBIN: 8.5 g/dL — AB (ref 12.0–15.0)
MCH: 32.3 pg (ref 26.0–34.0)
MCHC: 33.3 g/dL (ref 30.0–36.0)
MCV: 97 fL (ref 78.0–100.0)
Platelets: 290 10*3/uL (ref 150–400)
RBC: 2.63 MIL/uL — ABNORMAL LOW (ref 3.87–5.11)
RDW: 17.2 % — AB (ref 11.5–15.5)
WBC: 7.4 10*3/uL (ref 4.0–10.5)

## 2015-02-21 LAB — BASIC METABOLIC PANEL
ANION GAP: 10 (ref 5–15)
BUN: 11 mg/dL (ref 6–20)
CALCIUM: 8.7 mg/dL — AB (ref 8.9–10.3)
CO2: 31 mmol/L (ref 22–32)
CREATININE: 0.49 mg/dL (ref 0.44–1.00)
Chloride: 99 mmol/L — ABNORMAL LOW (ref 101–111)
GFR calc non Af Amer: >60 mL/min (ref >60)
Glucose, Bld: 91 mg/dL (ref 65–99)
Potassium: 4.6 mmol/L (ref 3.5–5.1)
SODIUM: 140 mmol/L (ref 135–145)

## 2015-02-21 LAB — VITAMIN B12: VITAMIN B 12: 78 pg/mL — AB (ref 180–914)

## 2015-02-21 LAB — GLUCOSE, CAPILLARY
Glucose-Capillary: 91 mg/dL (ref 65–99)
Glucose-Capillary: 226 mg/dL — ABNORMAL HIGH (ref 65–99)

## 2015-02-21 LAB — IRON AND TIBC
IRON: 27 ug/dL — AB (ref 28–170)
Saturation Ratios: 10 % — ABNORMAL LOW (ref 10.4–31.8)
TIBC: 262 ug/dL (ref 250–450)
UIBC: 235 ug/dL

## 2015-02-21 LAB — FOLATE: Folate: 19.7 ng/mL (ref >5.9)

## 2015-02-21 LAB — FERRITIN: FERRITIN: 21 ng/mL (ref 11–307)

## 2015-02-21 MED ORDER — MAGNESIUM OXIDE 400 (241.3 MG) MG PO TABS: 400.0000 mg | ORAL_TABLET | Freq: Two times a day (BID) | ORAL | Status: AC

## 2015-02-21 MED ORDER — ATORVASTATIN CALCIUM 20 MG PO TABS: 20.0000 mg | ORAL_TABLET | Freq: Every day | ORAL | Status: AC

## 2015-02-21 MED ORDER — CYANOCOBALAMIN 1000 MCG/ML IJ SOLN
1000.0000 ug | Freq: Once | INTRAMUSCULAR | Status: AC
Start: 2015-02-21 — End: 2015-02-21
  Administered 2015-02-21: 1000 ug via INTRAMUSCULAR
  Filled 2015-02-21: qty 1

## 2015-02-21 MED ORDER — POTASSIUM CHLORIDE CRYS ER 20 MEQ PO TBCR: 20.0000 meq | EXTENDED_RELEASE_TABLET | Freq: Every day | ORAL | Status: AC

## 2015-02-21 MED ORDER — METOPROLOL TARTRATE 25 MG PO TABS
25.0000 mg | ORAL_TABLET | Freq: Two times a day (BID) | ORAL | Status: AC
Start: 2015-02-21 — End: ?

## 2015-02-21 MED ORDER — AMIODARONE HCL 400 MG PO TABS: 400.0000 mg | ORAL_TABLET | Freq: Every day | ORAL | Status: AC

## 2015-02-21 MED ORDER — LISINOPRIL 10 MG PO TABS: 10.0000 mg | ORAL_TABLET | Freq: Every day | ORAL | Status: AC

## 2015-02-21 MED ORDER — AMIODARONE HCL 400 MG PO TABS
400.0000 mg | ORAL_TABLET | Freq: Two times a day (BID) | ORAL | Status: AC
Start: 2015-02-21 — End: ?

## 2015-02-21 NOTE — Care Management Note (Signed)
Case Management Note  Patient Details  Name: Chelsey Bishop MRN: [id number] Date of Birth: 25-Nov-1929  Subjective/Objective:    Hypoxia, COPD                Action/Plan:  NCM spoke to pt and dtr, Chelsey Bishop # [phone] at bedside. Dtr states she is at home with pt all the time. Requesting RW with seat, 3n1 bedside commode and oxygen for home. NCM contacted [facility] for DME for home.    Expected Discharge Date:  02/21/2015              Expected Discharge Plan:  Home/Self Care  In-House Referral:  NA  Discharge planning Services  CM Consult  Post Acute Care Choice:  NA Choice offered to:  NA  DME Arranged:  3-N-1, Walker rolling with seat, Oxygen DME Agency:  [place]:  NA HH Agency:  NA  Status of Service:  Completed, signed off  Medicare Important Message Given:  yes Date Medicare IM Given:    Medicare IM give by:    Date Additional Medicare IM Given:    Additional Medicare Important Message give by:     If discussed at [place] of Stay Meetings, dates discussed:    Additional Comments:  Erenest Rasher, RN 02/21/2015, 12:11 PM

## 2015-02-21 NOTE — Progress Notes (Signed)
Patient is awaiting discharge, with her daughter at the bedside.  [facility] was contacted by case management to supply  home O2.  [facility] staff have not yet arrived.

## 2015-02-21 NOTE — Progress Notes (Signed)
[facility] staff member has arrived with PO2 and equipment.  Patient is discharging home with daughter.

## 2015-02-21 NOTE — Progress Notes (Signed)
O2 sats tracking 83-88% on room air, while awake.  Replaced O2 at 2 liters nasal cannula.  Patient's daughter stated,"She(patient) may not have insulin anymore, I told the doctor last night".

## 2015-02-21 NOTE — Progress Notes (Signed)
Primary cardiologist: Dr. Lyman Bishop  Seen for followup: PAF/VT in setting of hypoxia, elevated troponin I, CAD  Subjective:    Just woke up this morning. States that she slept well. No recent chest pain. Ambulated with her daughter yesterday.  Objective:   Temp:  [97.4 F (36.3 C)-98.4 F (36.9 C)] 98.1 F (36.7 C) (12/31 1600) Pulse Rate:  [48-83] 57 (01/01 0600) Resp:  [15-26] 19 (01/01 0600) BP: (112-175)/(48-75) 121/63 mmHg (01/01 0400) SpO2:  [92 %-100 %] 100 % (01/01 0600) Last BM Date: 02/17/15  Filed Weights   02/17/15 1622 02/17/15 1745 02/20/15 0356  Weight: 93 lb 14.7 oz (42.6 kg) 94 lb 5.7 oz (42.8 kg) 99 lb 6.8 oz (45.1 kg)    Intake/Output Summary (Last 24 hours) at 02/21/15 0733 Last data filed at 02/21/15 0230  Gross per 24 hour  Intake  692.5 ml  Output    700 ml  Net   -7.5 ml    Telemetry: Sinus rhythm with occasional PVCs and intermittentbursts of NSVT - nonsustained.  Exam:  General: Frail elderly woman in no distress.  HEENT: Sclera clear, lids normal, oropharynx clear.  Lungs: Diminished breath sounds, no wheezing.  Cardiac: RRR without gallop.  Abdomen: NABS. Nontender.  Extremities: No pitting edema.  Lab Results:  Basic Metabolic Panel:  Recent Labs Lab 02/17/15 1407  02/19/15 0335 02/20/15 0644 02/20/15 1708 02/21/15 0358  NA 144  < > 140 137 139 140  K 3.8  < > 4.5 3.9 3.3* 4.6  CL 101  < > 100* 98* 100* 99*  CO2 31  < > 31 31 29 31   GLUCOSE 223*  < > 110* 297* 121* 91  BUN 17  < > 14 10 10 11   CREATININE 0.56  < > 0.54 0.50 0.61 0.49  CALCIUM 8.2*  < > 8.3* 8.3* 8.7* 8.7*  MG 1.2*  --  1.5*  --  1.3*  --   < > = values in this interval not displayed.  Liver Function Tests:  Recent Labs Lab 02/17/15 1407 02/18/15 0552  AST 17 21  ALT 12* 10*  ALKPHOS 46 45  BILITOT 0.3 0.1*  PROT 6.6 6.5  ALBUMIN 3.3* 3.1*    CBC:  Recent Labs Lab 02/19/15 0335 02/20/15 0644 02/21/15 0358  WBC 11.6* 8.2  7.4  HGB 9.0* 8.9* 8.5*  HCT 27.4* 26.7* 25.5*  MCV 97.9 97.4 97.0  PLT 300 299 290    Cardiac Enzymes:  Recent Labs Lab 02/17/15 1407 02/17/15 1725 02/17/15 2245 02/18/15 0552  CKTOTAL 30*  --   --   --   CKMB 3.2  --   --   --   TROPONINI 0.07* 0.06* 0.05* 0.05*    Coagulation:  Recent Labs Lab 02/17/15 1847 02/19/15 0335  INR 1.12 1.12    Cardiac catheterization 02/19/2015: Dominance: Right   Left Anterior Descending   . Mid LAD lesion, 25% stenosed.     Left Circumflex  There is mild the vessel.   . First Obtuse Marginal Branch   The vessel is small in size.   Marland Kitchen Second Obtuse Marginal Branch   There is mild disease in the vessel.     Right Coronary Artery   . Mid RCA lesion, 20% stenosed. Calcified.   . Third Right Posterolateral   The vessel is small in size.   . 3rd RPLB lesion, 80% stenosed. Calcified.      Echocardiogram 02/18/2015: Study Conclusions  - Left  ventricle: The cavity size was normal. There was mild concentric hypertrophy. Systolic function was vigorous. The estimated ejection fraction was in the range of 65% to 70%. Wall motion was normal; there were no regional wall motion abnormalities. Features are consistent with a pseudonormal left ventricular filling pattern, with concomitant abnormal relaxation and increased filling pressure (grade 2 diastolic dysfunction). Doppler parameters are consistent with elevated ventricular end-diastolic filling pressure. - Aortic valve: Trileaflet; mildly thickened, mildly calcified leaflets. Transvalvular velocity was minimally increased. There was no stenosis. There was no regurgitation. - Aortic root: The aortic root was normal in size. - Mitral valve: Calcified annulus. Mildly thickened leaflets . - Left atrium: The atrium was mildly dilated. - Right ventricle: Systolic function was normal. - Tricuspid valve: There was moderate regurgitation. - Pulmonic valve: There  was no regurgitation. - Pulmonary arteries: Systolic pressure was mildly to moderately increased. PA peak pressure: 45 mm Hg (S). - Inferior vena cava: The vessel was normal in size. - Pericardium, extracardiac: There was no pericardial effusion.   Medications:   Scheduled Medications: . amiodarone  400 mg Oral BID  . antiseptic oral rinse  7 mL Mouth Rinse BID  . aspirin EC  81 mg Oral Daily  . atorvastatin  20 mg Oral q1800  . feeding supplement (ENSURE ENLIVE)  237 mL Oral BID BM  . insulin aspart  0-9 Units Subcutaneous TID WC  . lisinopril  10 mg Oral Daily  . magnesium oxide  400 mg Oral BID  . metFORMIN  1,000 mg Oral Q supper  . metFORMIN  1,500 mg Oral Q breakfast  . metoprolol  25 mg Oral BID  . NIFEdipine  60 mg Oral Daily  . pantoprazole  40 mg Oral Daily  . potassium chloride  20 mEq Oral Daily  . sodium chloride  3 mL Intravenous Q12H    PRN Medications: sodium chloride, acetaminophen **OR** acetaminophen, acetaminophen, levalbuterol, sodium chloride   Assessment:   1. PAF and transient VT in setting of hypoxia. Currently in sinus rhythm on Amiodarone and Lopressor. LVEF is normal range. Continues to have intermittent NSVT.  2. Minor increase in troponin I, peak 0.07 and in relatively flat pattern. Likely secondary to myocardial strain rather than true ACS. Hepatin stopped 12/31. No chest pain.  3. CAD statis post cardiac catheterization 12/30 showing no major obstructive disease in the large epicardials with branch vessel disease (80% small 3rd PLB of RCA). Medical therapy is planned.  4. Recent GI workup with EGD showing healed ulcer in the antrum with some nodular tissue in the area but no significant mass lesion. Biopsies obtained and shows only intestinal metaplasia, no dysplasia or malignancy. She also has a large cecal polyp which is an adenoma, not cancerous based on prior biopsies. It looks like GI is holding off on colonoscopy now.  5. Known PAD  status post right fem-pop in the 1990s.  6. Short term memory deficits, known to daughter.  7. History of cerebral aneurysm with ICB 1993.   Plan/Discussion:    Stable for telemetry and possibly discharge later today from cardiac perspective. Still has intermittent asymptomatic WCT that is nonsustained. Would plan to address this with medical therapy in light of normal LVEF and only branch vessel CAD based on recent cardiac catheterization. She is hemodynamically stable on current regimen. Now on ASA, Lopressor, oral Amiodarone load (keep at 400 mg BID for one week then decrease to 200 mg BID), Procardia XL, Lisinopril (dose reduced from OP dose), and  Lipitor. Do not plan to start oral anticoagulation at this time in light of comorbidities and complex history as outlined above, particularly until clinical course in better stabilized including direction of anemia. She will need a close followup visit (1 week) with Dr. Debara Pickett or APP to reassess.    Satira Sark, M.D., F.A.C.C.

## 2015-02-21 NOTE — Progress Notes (Addendum)
SATURATION QUALIFICATIONS: (This note is used to comply with regulatory documentation for home oxygen)  Patient Saturations on Room Air at Rest = 83-88%  Patient Saturations on Room Air while Ambulating = 83 %  Patient Saturations on 2 Liters of oxygen while Ambulating = 94%  Please briefly explain why patient needs home oxygen: The patient's O2 SATs drop from 83-88% when asleep or ambulating.  The patient has to sit down and rest after a few steps.

## 2015-02-21 NOTE — Care Management Important Message (Signed)
Important Message  Patient Details  Name: BUNICE DELAHANTY MRN: [id number] Date of Birth: 01-May-1929   Medicare Important Message Given:  Yes    Erenest Rasher, RN 02/21/2015, 12:13 PM

## 2015-02-21 NOTE — Discharge Summary (Addendum)
Physician Discharge Summary  Chelsey Bishop [id number] DOB: [birth year] DOA: 02/17/2015  PCP: Criselda Peaches, MD  Admit date: 02/17/2015 Discharge date: 02/21/2015  Time spent: 35 minutes  Recommendations for Outpatient Follow-up:  1. Needs down-titration Amiodarone from 400 bid to 400 iod 02/28/15 2. See MAR for some other med changes-If needed or unaffordable, please check with patient Re: some meds which may/may not be covered under her insurance plan  3. Will need home oxygen on discharge 4. Needs LFT/TSH in 1 mo 5. Get bmet in about 1 week with magnesium level 6. Consider changing Aspirin to NOAC or coumadin as per Cardiology as OP 7. Needs b12 going forward-injection given prior to discharge  Discharge Diagnoses:  Principal Problem:   Hypoxia Active Problems:   History of gastric ulcer   COPD (chronic obstructive pulmonary disease) (HCC)   Paroxysmal ventricular tachycardia (HCC)   H/O: CVA (cerebrovascular accident)   Atrial fibrillation ([place])   Ventricular tachycardia, sustained ([place])   PAD (peripheral artery disease) ([place])   Status post femoral-popliteal bypass surgery   V-tach [facility])   History of aneurysm   Discharge Condition: improved  Diet recommendation: hh diabetic  Filed Weights   02/17/15 1622 02/17/15 1745 02/20/15 0356  Weight: 42.6 kg (93 lb 14.7 oz) 42.8 kg (94 lb 5.7 oz) 45.1 kg (99 lb 6.8 oz)    History of present illness:  82 y/o ? COPD HTn Remote R sided CVA noted 2009 PVD c R Fem-POP Bypass 1995 Brain Aneurysm 1993 DM ty II Prior CDiff colitis ?Prior DVT Pancreatic Pseudocyst 2009 Lingular nodule on CT chest Colonoscopy  EGD 3x20 mm ulcer 9.9.16-low grade dysplasia  Admitted from the endoscopy suite with hypoxia needed to be reintubated and found to be in A. fib 5 PM on 12/28 found to have sustained V. tach versus SVT with aberrancy Cardiology consulted Cath planned done 12/30 pm given decreased EF  Hospital Course:     NSVT, V. tach-management per cardiology [place] 2 score 8, with amiodarone 30 mg/h-->Amiodarone 400 bid. Cards ?metoprolol 50 twice-->25 bid 12/30. Anticoagulationokay per GI. Echo EF=65-70% Grade 2 dd Appreciate cardiology input. Keep magnesium above 2, potassium > 4. We will hold off on anticoagulation at present time as per cardiology despite elevated [place] score.  Cardiac caht showed  Transient hypoxia-2/2 to COPD, chest x-ray 12/28 benign--no wheeze no rales no rhonchi therefore hold steroids for now. As no cough would also hold antibiotics. desat screen showed that she would require home oxygen and this was ordered for her on d/c home  History of gastric ulcer and colon polyp-as per GI. Hold Asa for now. Continue pantoprazole 40 daily  Anemia likely secondary to B12 deficiency versus iron deficiency-B12 injection given prior to discharge  Diabetes mellitus type II-metformin 1.5 g a.m., 1 g p.m. on hold, Amaryl 2 mg on hold-low blood sugar 12/30 however started to have elevated blood sugars in the 400 range 12/31. She was very brittle on insulin and had also hypoglycemia-ultimately will go home on her oral DM meds without significant change  Hyperlipidemia-ho-continue atorvastatin 10 mg daily as substitution for simvastatin   Remote history of right-sided CVA 2009-aspirin as above  Hypertension-continue nifedipine 60 daily in addition to above meds. Lisinopril restarted at 10 mg daily  Brain aneurysm in 1993-currently stable and he MRI was performed 12/29 and at the M1 segment showed severe stenosis.   History of pancreatic pseudocyst-stable    Consultants:  Cardiology  Procedures:  Echo  Cardiac cath  12/30--  3rd RPLB lesion, 80% stenosed. This is a small branch vessel in the RCA territory, and not a great candidate for percutaneous intervention.  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  LVEDP 17 mm Hg.  Continue medical therapy for her arrhythmias and  small vessel disease in the posterolateral system. Spoke to Dr. Debara Pickett who will decide whether she needs long term anticoagulation for atrial fibrillation. LVgram not done to minimize contrast exposure.   Antibiotics:  none  Discharge Exam: Filed Vitals:   02/21/15 0900 02/21/15 0915  BP:    Pulse:    Temp:    Resp: 21 26   Fair no othe rissues Eating and drinking  General: alert pleasant in nad, no ict pallor No jvd Cardiovascular: s1 s 2no m/r/g Respiratory: clear no added sound No le edema abd soft  Discharge Instructions   Discharge Instructions    Diet - low sodium heart healthy    Complete by:  As directed      Discharge instructions    Complete by:  As directed   You will notice a lot of dosage changes on your medicines.  Make sure you take the Rx to your pharmacy and do not duplicate meds You will need lab work in about 1-2 weeks and close follow up with Cardiology notify medical personnel if you have chest pain , Nausea or lightheadedness that is persisitent     Increase activity slowly    Complete by:  As directed           Current Discharge Medication List    START taking these medications   Details  !! amiodarone (PACERONE) 400 MG tablet Take 1 tablet (400 mg total) by mouth 2 (two) times daily. Qty: 14 tablet, Refills: 0    !! amiodarone (PACERONE) 400 MG tablet Take 1 tablet (400 mg total) by mouth daily. Qty: 20 tablet, Refills: 0    atorvastatin (LIPITOR) 20 MG tablet Take 1 tablet (20 mg total) by mouth daily at 6 PM. Qty: 30 tablet, Refills: 0    magnesium oxide (MAG-OX) 400 (241.3 Mg) MG tablet Take 1 tablet (400 mg total) by mouth 2 (two) times daily. Qty: 60 tablet, Refills: 0    potassium chloride SA (K-DUR,KLOR-CON) 20 MEQ tablet Take 1 tablet (20 mEq total) by mouth daily. Qty: 30 tablet, Refills: 0     !! - Potential duplicate medications found. Please discuss with provider.    CONTINUE these medications which have CHANGED    Details  lisinopril (PRINIVIL,ZESTRIL) 10 MG tablet Take 1 tablet (10 mg total) by mouth daily. Qty: 30 tablet, Refills: 0    metoprolol tartrate (LOPRESSOR) 25 MG tablet Take 1 tablet (25 mg total) by mouth 2 (two) times daily. Qty: 60 tablet, Refills: 0      CONTINUE these medications which have NOT CHANGED   Details  aspirin 81 MG tablet Take 81 mg by mouth daily.    glimepiride (AMARYL) 4 MG tablet Take 2 mg by mouth daily with breakfast.    metFORMIN (GLUCOPHAGE) 1000 MG tablet Take 1,000-1,500 mg by mouth 2 (two) times daily with a meal. Takes 1 1/2 in am and 1 at night    Na Sulfate-K Sulfate-Mg Sulf SOLN Take as directed per Colonoscopy. Qty: 354 mL, Refills: 0   Associated Diagnoses: Gastric ulcer, unspecified chronicity; Anemia, unspecified anemia type; History of colonic polyps    NIFEdipine (PROCARDIA XL/ADALAT-CC) 60 MG 24 hr tablet Take 60 mg by mouth daily.  omeprazole (PRILOSEC) 40 MG capsule Take 1 capsule (40 mg total) by mouth daily. Qty: 90 capsule, Refills: 1      STOP taking these medications     simvastatin (ZOCOR) 40 MG tablet        No Known Allergies Follow-up Information    Follow up with Pixie Casino, MD In 1 week.   Specialty:  Cardiology   Why:  we will arrange for follow-up and contact you.   Contact information:   [place] [id number] [phone]        The results of significant diagnostics from this hospitalization (including imaging, microbiology, ancillary and laboratory) are listed below for reference.    Significant Diagnostic Studies: Mr Virgel Paling Wo Contrast  02/18/2015  CLINICAL DATA:  History of aneurysm. No previous imaging available to less. EXAM: MRA HEAD WITHOUT CONTRAST TECHNIQUE: Angiographic images of the Circle of Willis were obtained using MRA technique without intravenous contrast. COMPARISON:  None. FINDINGS: Both internal carotid arteries are patent through the skullbase and  siphon regions. There is moderate atherosclerotic narrowing in the carotid siphon regions, estimated at 40-50% on the right and 20-30% on the left. On the left, the anterior and middle cerebral arteries are patent without proximal stenosis, aneurysm or vascular malformation. On the right, the anterior cerebral artery appears normal. There is no abnormality of the M1 segment which looks like a severe stenosis. There is slight dilatation of the vessels in that region, but I can not diagnosis this as an aneurysm. Beyond that, the vessel appears widely patent. Both vertebral arteries are patent with the right being dominant. No basilar stenosis. Posterior circulation branch vessels are patent. There is some atherosclerotic irregularity of the more distal PCA branch is. IMPRESSION: No definable intracranial aneurysm. There is an abnormality of the M1 segment of the middle cerebral artery on the right most consistent with a severe stenosis. This would place the patient at risk of right MCA infarction. In the region of the stenosis, the vessel does show mild dilatation, but the appearance is not consistent with a berry aneurysm. Electronically Signed   By: Nelson Chimes M.D.   On: 02/18/2015 17:52   Dg Chest Port 1 View  02/17/2015  CLINICAL DATA:  Shortness of breath.  Hypoxia.  Recent endoscopy. EXAM: PORTABLE CHEST 1 VIEW COMPARISON:  Chest x-ray dated 08/17/2007 and CT scan of the chest dated 02/18/2010 FINDINGS: Heart size and pulmonary vascularity are normal. Extensive calcification in the thoracic aorta. There are no infiltrates or effusions. No pneumothorax. The lungs are slightly hyperinflated with flattening of the diaphragm suggesting emphysema. No acute osseous abnormality. IMPRESSION: No acute abnormality.  Probable COPD.  Aortic atherosclerosis. Electronically Signed   By: Lorriane Shire M.D.   On: 02/17/2015 14:23    Microbiology: Recent Results (from the past 240 hour(s))  MRSA PCR Screening      Status: None   Collection Time: 02/17/15  5:52 PM  Result Value Ref Range Status   MRSA by PCR NEGATIVE NEGATIVE Final    Comment:        The GeneXpert MRSA Assay (FDA approved for NASAL specimens only), is one component of a comprehensive MRSA colonization surveillance program. It is not intended to diagnose MRSA infection nor to guide or monitor treatment for MRSA infections.      Labs: Basic Metabolic Panel:  Recent Labs Lab 02/17/15 1407 02/18/15 0552 02/19/15 0335 02/20/15 0644 02/20/15 1708 02/21/15 0358  NA 144 139  140 137 139 140  K 3.8 4.8 4.5 3.9 3.3* 4.6  CL 101 100* 100* 98* 100* 99*  CO2 31 27 31 31 29 31   GLUCOSE 223* 248* 110* 297* 121* 91  BUN 17 14 14 10 10 11   CREATININE 0.56 0.56 0.54 0.50 0.61 0.49  CALCIUM 8.2* 8.2* 8.3* 8.3* 8.7* 8.7*  MG 1.2*  --  1.5*  --  1.3*  --    Liver Function Tests:  Recent Labs Lab 02/17/15 1407 02/18/15 0552  AST 17 21  ALT 12* 10*  ALKPHOS 46 45  BILITOT 0.3 0.1*  PROT 6.6 6.5  ALBUMIN 3.3* 3.1*   No results for input(s): LIPASE, AMYLASE in the last 168 hours. No results for input(s): AMMONIA in the last 168 hours. CBC:  Recent Labs Lab 02/17/15 1407 02/18/15 0552 02/19/15 0335 02/20/15 0644 02/21/15 0358  WBC 15.1* 20.2* 11.6* 8.2 7.4  HGB 9.6* 8.8* 9.0* 8.9* 8.5*  HCT 29.5* 26.6* 27.4* 26.7* 25.5*  MCV 96.7 96.7 97.9 97.4 97.0  PLT 312 311 300 299 290   Cardiac Enzymes:  Recent Labs Lab 02/17/15 1407 02/17/15 1725 02/17/15 2245 02/18/15 0552  CKTOTAL 30*  --   --   --   CKMB 3.2  --   --   --   TROPONINI 0.07* 0.06* 0.05* 0.05*   BNP: BNP (last 3 results) No results for input(s): BNP in the last 8760 hours.  ProBNP (last 3 results) No results for input(s): PROBNP in the last 8760 hours.  CBG:  Recent Labs Lab 02/20/15 1635 02/20/15 1651 02/20/15 1923 02/20/15 1940 02/20/15 2146  GLUCAP 35* 195* 36* 214* 120*       Signed:  Nita Sells MD    [facility] 02/21/2015, 9:32 AM

## 2015-02-23 ENCOUNTER — Encounter (HOSPITAL_COMMUNITY): Payer: Self-pay | Admitting: Interventional Cardiology

## 2015-02-23 ENCOUNTER — Telehealth: Payer: Self-pay | Admitting: *Deleted

## 2015-02-23 LAB — GLUCOSE, CAPILLARY: Glucose-Capillary: 24 mg/dL — CL (ref 65–99)

## 2015-02-23 NOTE — Telephone Encounter (Signed)
Scheduled f/u OV on 04/20/15  At 10:30 AM.with patient's daughter.

## 2015-02-23 NOTE — Telephone Encounter (Signed)
-----   Message from Manus Gunning, MD sent at 02/21/2015 10:40 AM EST ----- Hi Baley Lorimer,  Hope you had a nice holiday. This patient is being discharged over the weekend and needs follow up with me in a month or so. Can you help coordinate with her? Thanks

## 2015-03-03 ENCOUNTER — Ambulatory Visit: Payer: Medicare Other | Admitting: Physician Assistant

## 2015-03-24 DEATH — deceased

## 2015-04-20 ENCOUNTER — Ambulatory Visit: Payer: Medicare Other | Admitting: Gastroenterology
